# Patient Record
Sex: Male | Born: 1985 | Race: Black or African American | Hispanic: No | Marital: Married | Smoking: Current every day smoker
Health system: Southern US, Community
[De-identification: ages and names within clinical notes are randomized; demographics above are authoritative.]

## PROBLEM LIST (undated history)

## (undated) HISTORY — PX: FOOT SURGERY: SHX648

## (undated) HISTORY — PX: HAND SURGERY: SHX662

---

## 2017-06-27 ENCOUNTER — Emergency Department (HOSPITAL_COMMUNITY): Payer: Medicaid Other

## 2017-06-27 ENCOUNTER — Emergency Department (HOSPITAL_COMMUNITY)
Admission: EM | Admit: 2017-06-27 | Discharge: 2017-06-27 | Disposition: A | Discharge status: Home / Self Care | Payer: Medicaid Other | Attending: Emergency Medicine | Admitting: Emergency Medicine

## 2017-06-27 ENCOUNTER — Encounter (HOSPITAL_COMMUNITY): Payer: Self-pay | Admitting: *Deleted

## 2017-06-27 DIAGNOSIS — X58XXXA Exposure to other specified factors, initial encounter: Secondary | ICD-10-CM | POA: Insufficient documentation

## 2017-06-27 DIAGNOSIS — Y999 Unspecified external cause status: Secondary | ICD-10-CM | POA: Diagnosis not present

## 2017-06-27 DIAGNOSIS — Y939 Activity, unspecified: Secondary | ICD-10-CM | POA: Diagnosis not present

## 2017-06-27 DIAGNOSIS — M79672 Pain in left foot: Secondary | ICD-10-CM

## 2017-06-27 DIAGNOSIS — Y929 Unspecified place or not applicable: Secondary | ICD-10-CM | POA: Insufficient documentation

## 2017-06-27 DIAGNOSIS — F17200 Nicotine dependence, unspecified, uncomplicated: Secondary | ICD-10-CM | POA: Diagnosis not present

## 2017-06-27 DIAGNOSIS — S93402A Sprain of unspecified ligament of left ankle, initial encounter: Secondary | ICD-10-CM | POA: Insufficient documentation

## 2017-06-27 DIAGNOSIS — S99912A Unspecified injury of left ankle, initial encounter: Secondary | ICD-10-CM | POA: Diagnosis present

## 2017-06-27 MED ORDER — IBUPROFEN 400 MG PO TABS: 400.0000 mg | ORAL_TABLET | Freq: Four times a day (QID) | ORAL | 0 refills | Status: DC | PRN

## 2017-06-27 MED ORDER — ACETAMINOPHEN 325 MG PO TABS
650.0000 mg | ORAL_TABLET | Freq: Once | ORAL | Status: AC
  Administered 2017-06-27: 650 mg via ORAL
  Filled 2017-06-27: qty 2

## 2017-06-27 MED ORDER — IBUPROFEN 800 MG PO TABS
800.0000 mg | ORAL_TABLET | Freq: Once | ORAL | Status: AC
  Administered 2017-06-27: 800 mg via ORAL
  Filled 2017-06-27: qty 1

## 2017-06-27 NOTE — ED Triage Notes (Signed)
Lesion on left side of foot

## 2017-06-27 NOTE — Discharge Instructions (Signed)
The x-ray of your foot is negative for fracture or dislocation. Your examination suggest a ankle sprain/strain. There is also an area of your foot behind your little toe that has a callus formation. It is important that you see Dr. Milinda Pointer, or member of his team for podiatry assistance with this issue. Please use the Ace wrap and postoperative shoe until you're seen by Dr. Milinda Pointer. Warm Epsom salt soaks for 10-15 minutes daily may also help with some of the soreness and discomfort. Please use 400 mg of ibuprofen and 500 mg of Tylenol every 6 hours for your discomfort.

## 2017-06-27 NOTE — ED Provider Notes (Signed)
[facility] DEPT Provider Note   CSN: [id number] Arrival date & time: 06/27/17  1118     History   Chief Complaint Chief Complaint  Patient presents with  . Foot Pain    HPI Jeffrey Gardner is a 31 y.o. male.  Patient is a 31 year old male who presents to the emergency department with a complaint of left foot and ankle pain. Patient states that his foot is been bothering him for nearly a month. His been particularly bad in the last 2-3 days. The patient states that sometime ago he twisted his ankle, but beyond that he has been noticing a growth on the side of his foot that is getting progressively worse and causing problems with his work and even with his activities of daily living. He does not recall an injury to his foot other than mentioned he twisted it some time ago. No recent operations or procedures involving the left foot or ankle. The patient has tried Tylenol and ibuprofen but states these have not really been helping very much.        History reviewed. No pertinent past medical history.  There are no active problems to display for this patient.   Past Surgical History:  Procedure Laterality Date  . HAND SURGERY         Home Medications    Prior to Admission medications   Medication Sig Start Date End Date Taking? Authorizing Provider  ibuprofen (ADVIL,MOTRIN) 400 MG tablet Take 1 tablet (400 mg total) by mouth every 6 (six) hours as needed. 06/27/17   Lily Kocher, PA-C    Family History No family history on file.  Social History Social History  Substance Use Topics  . Smoking status: Current Every Day Smoker  . Smokeless tobacco: Never Used  . Alcohol use Yes     Allergies   Patient has no known allergies.   Review of Systems Review of Systems  Constitutional: Negative for activity change.       All ROS Neg except as noted in HPI  HENT: Negative for nosebleeds.   Eyes: Negative for photophobia and discharge.  Respiratory: Negative for  cough, shortness of breath and wheezing.   Cardiovascular: Negative for chest pain and palpitations.  Gastrointestinal: Negative for abdominal pain and blood in stool.  Genitourinary: Negative for dysuria, frequency and hematuria.  Musculoskeletal: Positive for arthralgias. Negative for back pain and neck pain.       Foot and ankle pain.  Skin: Negative.   Neurological: Negative for dizziness, seizures and speech difficulty.  Psychiatric/Behavioral: Negative for confusion and hallucinations.     Physical Exam Updated Vital Signs BP 110/78   Pulse 90   Temp 98.4 F (36.9 C) (Oral)   Resp 20   Ht 5\' 8"  (1.727 m)   Wt 59.4 kg (131 lb)   SpO2 96%   BMI 19.92 kg/m   Physical Exam  Musculoskeletal:       Feet:     ED Treatments / Results  Labs (all labs ordered are listed, but only abnormal results are displayed) Labs Reviewed - No data to display  EKG  EKG Interpretation None       Radiology Dg Foot Complete Left  Result Date: 06/27/2017 CLINICAL DATA:  Soft tissue swelling. Soft tissue prominence laterally with pain EXAM: LEFT FOOT - COMPLETE 3+ VIEW COMPARISON:  None. FINDINGS: Frontal, oblique, and lateral views obtained. There is localized soft tissue prominence lateral to the proximal fifth metatarsal. This area of soft tissue prominence  measures approximately 1.5 x 0.6 cm. There is no calcification or radiopaque foreign body in this area. No fracture or dislocation. No appreciable joint space narrowing or erosion. No bony destruction. There is a benign exostosis arising from the dorsal distal talus. IMPRESSION: Soft tissue prominence lateral to the proximal fifth metatarsal without associated calcification or radiopaque foreign body. A soft tissue mass in this area is questioned. No fracture or dislocation. No erosive change or bony destruction. No appreciable joint space narrowing. Small benign exostosis along the dorsal distal talus. From an imaging standpoint, MR  would be the imaging study of choice to further evaluate soft tissue mass in the foot region. Electronically Signed   By: Lowella Grip III M.D.   On: 06/27/2017 12:17    Procedures Procedures (including critical care time)  Medications Ordered in ED Medications  ibuprofen (ADVIL,MOTRIN) tablet 800 mg (not administered)  acetaminophen (TYLENOL) tablet 650 mg (not administered)     Initial Impression / Assessment and Plan / ED Course  I have reviewed the triage vital signs and the nursing notes.  Pertinent labs & imaging results that were available during my care of the patient were reviewed by me and considered in my medical decision making (see chart for details).      Final Clinical Impressions(s) / ED Diagnoses Vital signs reviewed. X-ray of the leftfoot and ankle are negative for fracture or dislocation. There is noted soft tissue swelling of the lateral portion of the foot.  Examination suggest ankle sprain, and callus formation of the lateral portion of the fifth metatarsal. Ace wrap applied. Patient fitted with a postoperative shoe Patient to use ibuprofen and Tylenol together for discomfort. He will also use warm soaks to the foot. Patient referred to podiatry.   Final diagnoses:  Sprain of left ankle, unspecified ligament, initial encounter  Foot pain, left    New Prescriptions New Prescriptions   IBUPROFEN (ADVIL,MOTRIN) 400 MG TABLET    Take 1 tablet (400 mg total) by mouth every 6 (six) hours as needed.     Lily Kocher, PA-C 06/28/17 1457    Milton Ferguson, MD 06/29/17 [id number]

## 2017-06-27 NOTE — ED Triage Notes (Signed)
L foot pain and swelling  No PCP

## 2017-07-15 ENCOUNTER — Encounter: Payer: Self-pay | Admitting: Podiatry

## 2017-07-15 ENCOUNTER — Ambulatory Visit (INDEPENDENT_AMBULATORY_CARE_PROVIDER_SITE_OTHER): Payer: Medicaid Other | Admitting: Podiatry

## 2017-07-15 DIAGNOSIS — M7752 Other enthesopathy of left foot: Secondary | ICD-10-CM | POA: Diagnosis not present

## 2017-07-15 MED ORDER — DICLOFENAC SODIUM 75 MG PO TBEC: 75.0000 mg | DELAYED_RELEASE_TABLET | Freq: Two times a day (BID) | ORAL | 0 refills | Status: DC

## 2017-07-15 NOTE — Progress Notes (Signed)
   Subjective:    Patient ID: Jeffrey Gardner, male    DOB: [birth year], 31 y.o.   MRN: [id number]  HPI    Review of Systems  Musculoskeletal: Positive for gait problem.  Skin: Positive for color change.  All other systems reviewed and are negative.      Objective:   Physical Exam        Assessment & Plan:

## 2017-07-18 MED ORDER — BETAMETHASONE SOD PHOS & ACET 6 (3-3) MG/ML IJ SUSP: 3.0000 mg | Freq: Once | INTRAMUSCULAR | Status: DC

## 2017-07-18 NOTE — Progress Notes (Signed)
   HPI: Patient is a 31 year old male presenting today as a new patient with a complaint of sharp pain to the lateral side of the left foot that began about 6 months ago. He reports associated swelling of the area. Walking and wearing shoes increase the pain. He has applied Tiger balm and soaked the foot in Epsom salt as treatment. He was told he had a tailor's bunion in the past. Pt was seen in the ED on 06/27/17 and had X-Rays done. He is here for further evaluation and treatment.   No past medical history on file.   Physical Exam: General: The patient is alert and oriented x3 in no acute distress.  Dermatology: Skin is warm, dry and supple bilateral lower extremities. Negative for open lesions or macerations.  Vascular: Palpable pedal pulses bilaterally. No edema or erythema noted. Capillary refill within normal limits.  Neurological: Epicritic and protective threshold grossly intact bilaterally.   Musculoskeletal Exam: Pain on palpation to 5th metatarsal base of left foot. Range of motion within normal limits to all pedal and ankle joints bilateral. Muscle strength 5/5 in all groups bilateral.    Assessment: - Prominent 5th metatarsal tubercle left - Adventitious bursitis left foot   Plan of Care:  - Patient evaluated. ED X-Rays reviewed.  - Injection of 0.5 mLs Celestone Soluspan injected into the base of the 5th metatarsal. Into the adventitious bursa - Post op shoe dispensed.  - Prescription for Diclofenac given to patient. - Return to clinic in 4 weeks; if not better, pt will likely need surgery.    Edrick Kins, DPM [facility]  Dr. Edrick Kins, DPM    [address], [place] [address]                Office [phone]  Fax [phone]

## 2017-07-24 ENCOUNTER — Telehealth: Payer: Self-pay | Admitting: *Deleted

## 2017-07-24 MED ORDER — MELOXICAM 15 MG PO TABS: 15.0000 mg | ORAL_TABLET | Freq: Every day | ORAL | 0 refills | Status: DC

## 2017-07-24 NOTE — Telephone Encounter (Signed)
Received request for alternate medication, pt's insurance does not cover. Dr. Amalia Hailey ordered Meloxicam 15mg  one tablet daily #30.

## 2017-08-15 ENCOUNTER — Ambulatory Visit: Payer: Medicaid Other | Admitting: Podiatry

## 2017-08-18 ENCOUNTER — Encounter (HOSPITAL_COMMUNITY): Payer: Self-pay

## 2017-08-18 ENCOUNTER — Emergency Department (HOSPITAL_COMMUNITY)
Admission: EM | Admit: 2017-08-18 | Discharge: 2017-08-18 | Disposition: A | Discharge status: Home / Self Care | Payer: Medicaid Other | Attending: Emergency Medicine | Admitting: Emergency Medicine

## 2017-08-18 DIAGNOSIS — Z79899 Other long term (current) drug therapy: Secondary | ICD-10-CM | POA: Insufficient documentation

## 2017-08-18 DIAGNOSIS — L720 Epidermal cyst: Secondary | ICD-10-CM | POA: Insufficient documentation

## 2017-08-18 DIAGNOSIS — R220 Localized swelling, mass and lump, head: Secondary | ICD-10-CM | POA: Diagnosis present

## 2017-08-18 DIAGNOSIS — F17200 Nicotine dependence, unspecified, uncomplicated: Secondary | ICD-10-CM | POA: Insufficient documentation

## 2017-08-18 MED ORDER — POVIDONE-IODINE 10 % EX SOLN
CUTANEOUS | Status: DC | PRN
  Administered 2017-08-18: 1 via TOPICAL
  Filled 2017-08-18: qty 15

## 2017-08-18 MED ORDER — LIDOCAINE-EPINEPHRINE (PF) 2 %-1:200000 IJ SOLN
10.0000 mL | Freq: Once | INTRAMUSCULAR | Status: AC
  Administered 2017-08-18: 10 mL via INTRADERMAL
  Filled 2017-08-18: qty 20

## 2017-08-18 MED ORDER — DOXYCYCLINE HYCLATE 100 MG PO TABS
100.0000 mg | ORAL_TABLET | Freq: Once | ORAL | Status: AC
Start: 2017-08-18 — End: 2017-08-18
  Administered 2017-08-18: 100 mg via ORAL
  Filled 2017-08-18: qty 1

## 2017-08-18 MED ORDER — DOXYCYCLINE HYCLATE 100 MG PO CAPS: 100.0000 mg | ORAL_CAPSULE | Freq: Two times a day (BID) | ORAL | 0 refills | Status: DC

## 2017-08-18 NOTE — ED Provider Notes (Signed)
[facility] EMERGENCY DEPARTMENT Provider Note   CSN: [id number] Arrival date & time: 08/18/17  1121     History   Chief Complaint Chief Complaint  Patient presents with  . Facial Swelling    HPI Jeffrey Gardner is a 31 y.o. male.  HPI   Jeffrey Gardner is a 31 y.o. male who presents to the Emergency Department complaining of pain and swelling to the left face.  Has hx of recurrent abscess.  Pain feels similar to previous.  Symptoms begin several hours prior to arrival.  He denies dental pain, difficulty swallowing, fever and neck pain.  Nothing makes the symptoms better or worse.   History reviewed. No pertinent past medical history.  There are no active problems to display for this patient.   Past Surgical History:  Procedure Laterality Date  . HAND SURGERY         Home Medications    Prior to Admission medications   Medication Sig Start Date End Date Taking? Authorizing Provider  meloxicam (MOBIC) 15 MG tablet Take 1 tablet (15 mg total) by mouth daily. 07/24/17  Yes Edrick Kins, DPM  ibuprofen (ADVIL,MOTRIN) 400 MG tablet Take 1 tablet (400 mg total) by mouth every 6 (six) hours as needed. Patient not taking: Reported on 08/18/2017 06/27/17   Lily Kocher, PA-C    Family History No family history on file.  Social History Social History  Substance Use Topics  . Smoking status: Current Every Day Smoker  . Smokeless tobacco: Never Used  . Alcohol use Yes     Allergies   Patient has no known allergies.   Review of Systems Review of Systems  Constitutional: Negative for chills and fever.  Gastrointestinal: Negative for nausea and vomiting.  Musculoskeletal: Negative for arthralgias and joint swelling.  Skin: Positive for color change.       Abscess   Hematological: Negative for adenopathy.  All other systems reviewed and are negative.    Physical Exam Updated Vital Signs BP 128/82 (BP Location: Right Arm)   Pulse 73   Temp 98.5 F (36.9 C)  (Oral)   Resp (!) 1   Ht 5\' 8"  (1.727 m)   Wt 59.9 kg (132 lb)   SpO2 100%   BMI 20.07 kg/m   Physical Exam  Constitutional: He is oriented to person, place, and time. He appears well-developed and well-nourished. No distress.  HENT:  Head: Normocephalic and atraumatic.    Two flesh colored areas of induration and fluctuance to left face.  Pt has chronic appearing acne scars and comedones to the face.   Neck: Normal range of motion.  Cardiovascular: Normal rate, regular rhythm and normal heart sounds.   No murmur heard. Pulmonary/Chest: Effort normal and breath sounds normal. No respiratory distress.  Lymphadenopathy:    He has no cervical adenopathy.  Neurological: He is alert and oriented to person, place, and time. He exhibits normal muscle tone. Coordination normal.  Skin: Skin is warm and dry.  Nursing note and vitals reviewed.    ED Treatments / Results  Labs (all labs ordered are listed, but only abnormal results are displayed) Labs Reviewed - No data to display  EKG  EKG Interpretation None       Radiology No results found.  Procedures Procedures (including critical care time)  INCISION AND DRAINAGE #1  Performed by: Hale Bogus. Consent: Verbal consent obtained. Risks and benefits: risks, benefits and alternatives were discussed Type: abscess  Body area: left face  Anesthesia: local  infiltration  Incision was made with a # 11 scalpel.  Local anesthetic: lidocaine 2 % w/ epinephrine  Anesthetic total: 1 ml  Complexity: complex Blunt dissection to break up loculations  Drainage: purulent  Drainage amount: moderate  Packing material: none Patient tolerance: Patient tolerated the procedure well with no immediate complications.  INCISION AND DRAINAGE #2  Performed by: Hale Bogus. Consent: Verbal consent obtained. Risks and benefits: risks, benefits and alternatives were discussed Type: abscess  Body area: left  face  Anesthesia: local infiltration  Incision was made with a # 11 scalpel.  Local anesthetic: lidocaine 2 % w/ epinephrine  Anesthetic total: 1 ml  Complexity: complex Blunt dissection to break up loculations  Drainage: purulent  Drainage amount: moderate  Packing material: none Patient tolerance: Patient tolerated the procedure well with no immediate complications.    Medications Ordered in ED Medications  povidone-iodine (BETADINE) 10 % external solution (1 application Topical Given 08/18/17 1324)  doxycycline (VIBRA-TABS) tablet 100 mg (not administered)  lidocaine-EPINEPHrine (XYLOCAINE W/EPI) 2 %-1:200000 (PF) injection 10 mL (10 mLs Intradermal Given 08/18/17 1325)     Initial Impression / Assessment and Plan / ED Course  I have reviewed the triage vital signs and the nursing notes.  Pertinent labs & imaging results that were available during my care of the patient were reviewed by me and considered in my medical decision making (see chart for details).     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  Patient agrees to warm compresses prescription for doxycycline. Appears stable for discharge.  Final Clinical Impressions(s) / ED Diagnoses   Final diagnoses:  Epidermal cyst of face    New Prescriptions New Prescriptions   No medications on file     Kem Parkinson, Hershal Coria 08/19/17 1746    Mesner, Corene Cornea, MD 08/21/17 1227

## 2017-08-18 NOTE — Discharge Instructions (Signed)
Continue warm wet compresses on and off to your face until the area heals. Take the antibiotic as directed until its finished. Return here for any worsening symptoms.

## 2017-08-18 NOTE — ED Triage Notes (Signed)
Reports of swelling/possible abscess to left side of face since this morning. Patient reports frequent abscess to face.

## 2017-08-22 ENCOUNTER — Ambulatory Visit (INDEPENDENT_AMBULATORY_CARE_PROVIDER_SITE_OTHER): Payer: Medicaid Other | Admitting: Podiatry

## 2017-08-22 ENCOUNTER — Encounter: Payer: Self-pay | Admitting: Podiatry

## 2017-08-22 DIAGNOSIS — M7752 Other enthesopathy of left foot: Secondary | ICD-10-CM | POA: Diagnosis not present

## 2017-08-22 NOTE — Patient Instructions (Signed)
Pre-Operative Instructions  Congratulations, you have decided to take an important step towards improving your quality of life.  You can be assured that the doctors and staff at [facility] will be with you every step of the way.  Here are some important things you should know:  1. Plan to be at the surgery center/hospital at least 1 (one) hour prior to your scheduled time, unless otherwise directed by the surgical center/hospital staff.  You must have a responsible adult accompany you, remain during the surgery and drive you home.  Make sure you have directions to the surgical center/hospital to ensure you arrive on time. 2. If you are having surgery at [facility] or [facility], you will need a copy of your medical history and physical form from your family physician within one month prior to the date of surgery. We will give you a form for your primary physician to complete.  3. We make every effort to accommodate the date you request for surgery.  However, there are times where surgery dates or times have to be moved.  We will contact you as soon as possible if a change in schedule is required.   4. No aspirin/ibuprofen for one week before surgery.  If you are on aspirin, any non-steroidal anti-inflammatory medications (Mobic, Aleve, Ibuprofen) should not be taken seven (7) days prior to your surgery.  You make take Tylenol for pain prior to surgery.  5. Medications - If you are taking daily heart and blood pressure medications, seizure, reflux, allergy, asthma, anxiety, pain or diabetes medications, make sure you notify the surgery center/hospital before the day of surgery so they can tell you which medications you should take or avoid the day of surgery. 6. No food or drink after midnight the night before surgery unless directed otherwise by surgical center/hospital staff. 7. No alcoholic beverages 24-hours prior to surgery.  No smoking 24-hours prior or 24-hours after  surgery. 8. Wear loose pants or shorts. They should be loose enough to fit over bandages, boots, and casts. 9. Don't wear slip-on shoes. Sneakers are preferred. 10. Bring your boot with you to the surgery center/hospital.  Also bring crutches or a walker if your physician has prescribed it for you.  If you do not have this equipment, it will be provided for you after surgery. 11. If you have not been contacted by the surgery center/hospital by the day before your surgery, call to confirm the date and time of your surgery. 12. Leave-time from work may vary depending on the type of surgery you have.  Appropriate arrangements should be made prior to surgery with your employer. 13. Prescriptions will be provided immediately following surgery by your doctor.  Fill these as soon as possible after surgery and take the medication as directed. Pain medications will not be refilled on weekends and must be approved by the doctor. 14. Remove nail polish on the operative foot and avoid getting pedicures prior to surgery. 15. Wash the night before surgery.  The night before surgery wash the foot and leg well with water and the antibacterial soap provided. Be sure to pay special attention to beneath the toenails and in between the toes.  Wash for at least three (3) minutes. Rinse thoroughly with water and dry well with a towel.  Perform this wash unless told not to do so by your physician.  Enclosed: 1 Ice pack (please put in freezer the night before surgery)   1 Hibiclens skin cleaner     Pre-op instructions  If you have any questions regarding the instructions, please do not hesitate to call our office.  [place]: [address], [place], [place] [address] -- [phone]  [place]: [address] [place], [place] [address] -- [phone]  [place]: [address]  [place], [place] [address] -- [phone]  [place]: [address], [place], [place] [address] -- [phone]  Website:  [email or web address] 

## 2017-08-25 NOTE — Progress Notes (Signed)
   HPI: Patient is a 31 year old male presenting today for follow-up evaluation of left foot pain. He states the pain is worsening. He rates it a 10/10 at this time. He reports the injection and taking diclofenac provided no significant relief. He is here for further evaluation and treatment.    No past medical history on file.   Physical Exam: General: The patient is alert and oriented x3 in no acute distress.  Dermatology: Skin is warm, dry and supple bilateral lower extremities. Negative for open lesions or macerations.  Vascular: Palpable pedal pulses bilaterally. No edema or erythema noted. Capillary refill within normal limits.  Neurological: Epicritic and protective threshold grossly intact bilaterally.   Musculoskeletal Exam: Pain on palpation to 5th metatarsal base of left foot. Range of motion within normal limits to all pedal and ankle joints bilateral. Muscle strength 5/5 in all groups bilateral.    Assessment: - Prominent 5th metatarsal tubercle left - Adventitious bursitis left foot   Plan of Care:  1. Today the patient was evaluated. 2. Today we discussed the conservative versus surgical management of the presenting pathology. The patient opts for surgical management. All possible complications and details of the procedure were explained. All patient questions were answered. No guarantees were expressed or implied. 3. Authorization for surgery was initiated today. Surgery will consist of excision of soft tissue bursa of the left foot and exostectomy fifth metatarsal base left foot. 4. Return to clinic 1 week postop.    Edrick Kins, DPM [facility]  Dr. Edrick Kins, DPM    [address], [place] [address]                Office [phone]  Fax [phone]

## 2017-08-26 ENCOUNTER — Telehealth: Payer: Self-pay | Admitting: Podiatry

## 2017-08-26 ENCOUNTER — Telehealth: Payer: Self-pay | Admitting: *Deleted

## 2017-08-26 NOTE — Telephone Encounter (Signed)
I'm returning your call.  Have you signed consent forms?  "Yes, I signed them when I saw him last week.  I want to get it done as soon as possible because my foot is killing me and I need to get back to work."  He can do it on November 8. "Yes ma'am, that is fine if it's the soonest."  You can register with the surgical center on-line now.  If you don't have access to a computer, they will call you to get needed information.  Someone from the surgical center will call you with the arrival time a day or two prior to the surgery date.  (Please send me the scheduling sheet for this patient.)

## 2017-08-26 NOTE — Telephone Encounter (Signed)
"  I was calling to schedule an appointment with the foot surgeon."

## 2017-09-11 ENCOUNTER — Encounter: Payer: Self-pay | Admitting: Podiatry

## 2017-09-11 DIAGNOSIS — M21542 Acquired clubfoot, left foot: Secondary | ICD-10-CM | POA: Diagnosis not present

## 2017-09-11 DIAGNOSIS — D492 Neoplasm of unspecified behavior of bone, soft tissue, and skin: Secondary | ICD-10-CM | POA: Diagnosis not present

## 2017-09-12 ENCOUNTER — Telehealth: Payer: Self-pay | Admitting: Podiatry

## 2017-09-12 NOTE — Telephone Encounter (Signed)
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 Pt just dropped off a prescription for percocet that was written but a date was not on the prescription. I believe he had surgery today but I cannot fill it unless I verify that it was written today. Trying to figure out what I should do here, I know its after hours but they are waiting on it cause he got out of surgery. If you would give me a call back at [phone]. Thanks. Bye.

## 2017-09-12 NOTE — Telephone Encounter (Signed)
Pt's girlfriend states pt has surgery yesterday, and the pharmacy will not fill because it is not dated. I spoke with [facility] and informed, that pt had surgery yesterday and the script was to be dated for 09/11/2017.

## 2017-09-19 ENCOUNTER — Ambulatory Visit (INDEPENDENT_AMBULATORY_CARE_PROVIDER_SITE_OTHER): Payer: Medicaid Other

## 2017-09-19 ENCOUNTER — Encounter: Payer: Self-pay | Admitting: Podiatry

## 2017-09-19 ENCOUNTER — Ambulatory Visit (INDEPENDENT_AMBULATORY_CARE_PROVIDER_SITE_OTHER): Payer: Medicaid Other | Admitting: Podiatry

## 2017-09-19 ENCOUNTER — Other Ambulatory Visit: Payer: Self-pay

## 2017-09-19 ENCOUNTER — Encounter: Payer: Medicaid Other | Admitting: Podiatry

## 2017-09-19 DIAGNOSIS — Z9889 Other specified postprocedural states: Secondary | ICD-10-CM

## 2017-09-19 DIAGNOSIS — M21542 Acquired clubfoot, left foot: Secondary | ICD-10-CM

## 2017-09-19 MED ORDER — OXYCODONE-ACETAMINOPHEN 5-325 MG PO TABS: ORAL_TABLET | ORAL | 0 refills | Status: DC

## 2017-09-22 NOTE — Progress Notes (Signed)
   Subjective:  Patient presents today status post left 5th metatarsal exostectomy. DOS: 09/11/17. He states he is doing well but is requesting a refill on his pain medication. He also requests an order for crutches. He is here for further evaluation and treatment.    No past medical history on file.    Objective/Physical Exam Skin incisions appear to be well coapted with sutures and staples intact. No sign of infectious process noted. No dehiscence. No active bleeding noted. Moderate edema noted to the surgical extremity.  Radiographic Exam:  Osteotomies sites appear to be stable with routine healing.  Assessment: 1. s/p left 5th metatarsal exostectomy. DOS: 09/11/17   Plan of Care:  1. Patient was evaluated. X-rays reviewed 2. Dressing changed. 3. Continue weightbearing in CAM boot. 4. Refill prescription for Percocet 5/325 mg given to patient. 5. Return to clinic in 1 week.   Edrick Kins, DPM [facility]  Dr. Edrick Kins, [address]                                        [place], [place] [address]                Office [phone]  Fax [phone]

## 2017-09-30 ENCOUNTER — Encounter: Payer: Medicaid Other | Admitting: Podiatry

## 2017-09-30 ENCOUNTER — Encounter: Payer: Self-pay | Admitting: Podiatry

## 2017-09-30 ENCOUNTER — Ambulatory Visit (INDEPENDENT_AMBULATORY_CARE_PROVIDER_SITE_OTHER): Payer: Medicaid Other | Admitting: Podiatry

## 2017-09-30 VITALS — BP 110/88 | HR 98 | Temp 97.9°F

## 2017-09-30 DIAGNOSIS — Z9889 Other specified postprocedural states: Secondary | ICD-10-CM

## 2017-09-30 MED ORDER — OXYCODONE-ACETAMINOPHEN 5-325 MG PO TABS: ORAL_TABLET | ORAL | 0 refills | Status: DC

## 2017-10-02 NOTE — Progress Notes (Signed)
   Subjective:  Patient presents today status post left 5th metatarsal exostectomy. DOS: 09/11/17. He reports sharp pain to the dorsum of the foot. He states he is ready for his staples to be removed. There are no modifying factors noted. Patient is here for further evaluation and treatment.   No past medical history on file.    Objective/Physical Exam Skin incisions appear to be well coapted with sutures and staples intact. No sign of infectious process noted. No dehiscence. No active bleeding noted. Moderate edema noted to the surgical extremity.  Assessment: 1. s/p left 5th metatarsal exostectomy. DOS: 09/11/17   Plan of Care:  1. Patient was evaluated.  2. Staples removed. 3. Refill prescription for Percocet given to patient. 4. Continue weightbearing in CAM boot.  5. Return to clinic in 4 weeks.   Edrick Kins, DPM [facility]  Dr. Edrick Kins, [address]                                        [place], [place] [address]                Office [phone]  Fax [phone]

## 2017-10-24 ENCOUNTER — Ambulatory Visit (INDEPENDENT_AMBULATORY_CARE_PROVIDER_SITE_OTHER): Payer: Medicaid Other | Admitting: Podiatry

## 2017-10-24 ENCOUNTER — Ambulatory Visit (INDEPENDENT_AMBULATORY_CARE_PROVIDER_SITE_OTHER): Payer: Medicaid Other

## 2017-10-24 ENCOUNTER — Encounter: Payer: Self-pay | Admitting: Podiatry

## 2017-10-24 DIAGNOSIS — Z9889 Other specified postprocedural states: Secondary | ICD-10-CM

## 2017-10-24 DIAGNOSIS — M21542 Acquired clubfoot, left foot: Secondary | ICD-10-CM

## 2017-10-26 ENCOUNTER — Emergency Department (HOSPITAL_COMMUNITY)
Admission: EM | Admit: 2017-10-26 | Discharge: 2017-10-26 | Disposition: A | Discharge status: Home / Self Care | Payer: Medicaid Other | Attending: Emergency Medicine | Admitting: Emergency Medicine

## 2017-10-26 ENCOUNTER — Encounter (HOSPITAL_COMMUNITY): Payer: Self-pay | Admitting: Emergency Medicine

## 2017-10-26 ENCOUNTER — Other Ambulatory Visit: Payer: Self-pay

## 2017-10-26 DIAGNOSIS — X58XXXA Exposure to other specified factors, initial encounter: Secondary | ICD-10-CM | POA: Insufficient documentation

## 2017-10-26 DIAGNOSIS — H5713 Ocular pain, bilateral: Secondary | ICD-10-CM | POA: Diagnosis present

## 2017-10-26 DIAGNOSIS — Z79899 Other long term (current) drug therapy: Secondary | ICD-10-CM | POA: Insufficient documentation

## 2017-10-26 DIAGNOSIS — S0502XA Injury of conjunctiva and corneal abrasion without foreign body, left eye, initial encounter: Secondary | ICD-10-CM | POA: Diagnosis not present

## 2017-10-26 DIAGNOSIS — Y939 Activity, unspecified: Secondary | ICD-10-CM | POA: Insufficient documentation

## 2017-10-26 DIAGNOSIS — F17210 Nicotine dependence, cigarettes, uncomplicated: Secondary | ICD-10-CM | POA: Diagnosis not present

## 2017-10-26 DIAGNOSIS — Y929 Unspecified place or not applicable: Secondary | ICD-10-CM | POA: Diagnosis not present

## 2017-10-26 DIAGNOSIS — Y999 Unspecified external cause status: Secondary | ICD-10-CM | POA: Insufficient documentation

## 2017-10-26 MED ORDER — ERYTHROMYCIN 5 MG/GM OP OINT: TOPICAL_OINTMENT | OPHTHALMIC | 0 refills | Status: DC

## 2017-10-26 MED ORDER — TETRACAINE HCL 0.5 % OP SOLN
1.0000 [drp] | Freq: Once | OPHTHALMIC | Status: AC
  Administered 2017-10-26: 1 [drp] via OPHTHALMIC

## 2017-10-26 MED ORDER — TETRACAINE HCL 0.5 % OP SOLN
OPHTHALMIC | Status: AC
  Administered 2017-10-26: 1 [drp] via OPHTHALMIC
  Filled 2017-10-26: qty 4

## 2017-10-26 MED ORDER — CEPHALEXIN 500 MG PO CAPS: 500.0000 mg | ORAL_CAPSULE | Freq: Four times a day (QID) | ORAL | 0 refills | Status: DC

## 2017-10-26 MED ORDER — FLUORESCEIN SODIUM 1 MG OP STRP
ORAL_STRIP | OPHTHALMIC | Status: AC
  Administered 2017-10-26: 1 via OPHTHALMIC
  Filled 2017-10-26: qty 1

## 2017-10-26 MED ORDER — FLUORESCEIN SODIUM 1 MG OP STRP
1.0000 | ORAL_STRIP | Freq: Once | OPHTHALMIC | Status: AC
  Administered 2017-10-26: 1 via OPHTHALMIC

## 2017-10-26 NOTE — ED Triage Notes (Signed)
PT c/o left eye redness, drainage and pain x1 day.

## 2017-10-26 NOTE — ED Provider Notes (Signed)
[facility] EMERGENCY DEPARTMENT Provider Note   CSN: [id number] Arrival date & time: 10/26/17  1140     History   Chief Complaint Chief Complaint  Patient presents with  . Conjunctivitis    HPI Jeffrey Gardner is a 31 y.o. male who presents with a 1 day history of left eye irritation.  Patient thinks he may have gotten something in his eye, like a hair, however he is not able to get anything out of it.  He has a foreign body sensation and irritation.  He has had some mild, intermittent blurriness, however otherwise vision is unaffected.  He wears glasses, but not contacts.  He has had some redness to his left eyelid, but no significant pain.  He feels the irritation and foreign body sensation more when he moves his eye, but denies pain with extraocular movements.  HPI  History reviewed. No pertinent past medical history.  There are no active problems to display for this patient.   Past Surgical History:  Procedure Laterality Date  . FOOT SURGERY    . HAND SURGERY         Home Medications    Prior to Admission medications   Medication Sig Start Date End Date Taking? Authorizing Provider  cephALEXin (KEFLEX) 500 MG capsule Take 1 capsule (500 mg total) by mouth 4 (four) times daily. 10/26/17   Frederica Kuster, PA-C  doxycycline (VIBRAMYCIN) 100 MG capsule Take 1 capsule (100 mg total) by mouth 2 (two) times daily. 08/18/17   Triplett, Tammy, PA-C  erythromycin ophthalmic ointment Place a 1/2 inch ribbon of ointment into the lower eyelid 4 times daily for 5 days. 10/26/17   Cameryn Chrisley, Bea Graff, PA-C  ibuprofen (ADVIL,MOTRIN) 400 MG tablet Take 1 tablet (400 mg total) by mouth every 6 (six) hours as needed. Patient not taking: Reported on 08/18/2017 06/27/17   Lily Kocher, PA-C  meloxicam (MOBIC) 15 MG tablet Take 1 tablet (15 mg total) by mouth daily. 07/24/17   Edrick Kins, DPM  oxyCODONE-acetaminophen (PERCOCET/ROXICET) 5-325 MG tablet take 1 tablet by mouth every 4 hours  if needed for pain 09/30/17   Edrick Kins, DPM    Family History History reviewed. No pertinent family history.  Social History Social History   Tobacco Use  . Smoking status: Current Every Day Smoker    Packs/day: 0.50    Types: Cigarettes  . Smokeless tobacco: Never Used  Substance Use Topics  . Alcohol use: Yes    Comment: occ  . Drug use: No     Allergies   Patient has no known allergies.   Review of Systems Review of Systems  Constitutional: Negative for fever.  Eyes: Positive for pain (irritation, FB sensation), discharge (some intermittent clear), redness and visual disturbance (mild blurred vision). Negative for photophobia.     Physical Exam Updated Vital Signs BP 118/88 (BP Location: Right Arm)   Pulse 97   Temp 98.7 F (37.1 C) (Oral)   Resp 18   Ht 5\' 9"  (1.753 m)   Wt 62.1 kg (137 lb)   SpO2 100%   BMI 20.23 kg/m   Physical Exam  Constitutional: He appears well-developed and well-nourished. No distress.  HENT:  Head: Normocephalic and atraumatic.  Mouth/Throat: Oropharynx is clear and moist. No oropharyngeal exudate.  Eyes: EOM are normal. Pupils are equal, round, and reactive to light. Lids are everted and swept, no foreign bodies found. Right eye exhibits no discharge. Left eye exhibits no discharge. No foreign body  present in the left eye. Left conjunctiva is injected. No scleral icterus.  Slit lamp exam:      The left eye shows corneal abrasion and fluorescein uptake. The left eye shows no corneal flare, no corneal ulcer and no foreign body.    L lid erythematous and mildly edematous No pain with EOMs   Visual Acuity  Right Eye Distance: 20/100 Left Eye Distance: 20/100 Bilateral Distance: 20/70  (pt wears glassess but does not have them on or with him)  Neck: Normal range of motion. Neck supple. No thyromegaly present.  Cardiovascular: Normal rate, regular rhythm, normal heart sounds and intact distal pulses. Exam reveals no gallop  and no friction rub.  No murmur heard. Pulmonary/Chest: Effort normal and breath sounds normal. No stridor. No respiratory distress. He has no wheezes. He has no rales.  Musculoskeletal: He exhibits no edema.  Lymphadenopathy:    He has no cervical adenopathy.  Neurological: He is alert. Coordination normal.  Skin: Skin is warm and dry. No rash noted. He is not diaphoretic. No pallor.  Psychiatric: He has a normal mood and affect.  Nursing note and vitals reviewed.    ED Treatments / Results  Labs (all labs ordered are listed, but only abnormal results are displayed) Labs Reviewed - No data to display  EKG  EKG Interpretation None       Radiology No results found.  Procedures Procedures (including critical care time)  Medications Ordered in ED Medications  fluorescein ophthalmic strip 1 strip (1 strip Right Eye Given 10/26/17 1342)  tetracaine (PONTOCAINE) 0.5 % ophthalmic solution 1 drop (1 drop Left Eye Given 10/26/17 1342)     Initial Impression / Assessment and Plan / ED Course  I have reviewed the triage vital signs and the nursing notes.  Pertinent labs & imaging results that were available during my care of the patient were reviewed by me and considered in my medical decision making (see chart for details).     Pt with corneal abrasion on exam. No evidence of FB.  Exam not concerning for orbital cellulitis, hyphema. No concern for uveitis.  Visual acuity poor, however patient does not have his glasses with him today.  He denies any significant blurred vision outside of baseline.  Patient will be discharged home with erythromycin ointment and Keflex, considering erythema and edema to left lid.  Does not extend around the eye.  No pain with EOMs.   Patient understands to follow up with ophthalmology; return precautions discussed.  Patient understands and agrees with plan.  Patient vitals stable throughout ED course and discharged in satisfactory condition.      Final Clinical Impressions(s) / ED Diagnoses   Final diagnoses:  Abrasion of left cornea, initial encounter    ED Discharge Orders        Ordered    erythromycin ophthalmic ointment  Status:  Discontinued     10/26/17 1339    cephALEXin (KEFLEX) 500 MG capsule  4 times daily     10/26/17 1341    erythromycin ophthalmic ointment     10/26/17 [address] PA-C 10/26/17 Biron, [place], DO 10/28/17 [phone]

## 2017-10-26 NOTE — Discharge Instructions (Signed)
Apply erythromycin ointment 4 times daily in your lower eyelid.  Do this for 5 days.  Also take Keflex 4 times daily for concern for infection starting at your eyelid.  Please be evaluated by ophthalmologist in 2-3 days for recheck.  Please return to the emergency department if you develop any new or worsening symptoms.

## 2017-10-26 NOTE — ED Notes (Signed)
Pt reports he felt particle to left eye yesterday before going to work.  Once getting to work, he felt the particle again when looking up.  Left eye is red and lid is swollen.

## 2017-10-30 NOTE — Progress Notes (Signed)
   Subjective:  Patient presents today status post left 5th metatarsal exostectomy. DOS: 09/11/17.  He states his condition has improved.  He reports pain with walking describing it as needles sticking into the foot.  He also reports an associated callus to the plantar aspect of the lateral left foot.  Patient is here for further evaluation and treatment.   History reviewed. No pertinent past medical history.    Objective/Physical Exam Skin incisions appear to be well coapted. No sign of infectious process noted. No dehiscence. No active bleeding noted. Moderate edema noted to the surgical extremity.  Radiographic Exam:  Osteotomies sites appear to be stable with routine healing.  Assessment: 1. s/p left 5th metatarsal exostectomy. DOS: 09/11/17   Plan of Care:  1.  Patient was evaluated.  X-rays reviewed. 2.  Resume wearing good shoes.  Discontinue wearing cam boot. 3.  Slowly increase activity. 4.  Return to clinic as needed.   Edrick Kins, DPM [facility]  Dr. Edrick Kins, [address]                                        [place], [place] [address]                Office [phone]  Fax [phone]

## 2017-12-07 ENCOUNTER — Emergency Department (HOSPITAL_COMMUNITY)
Admission: EM | Admit: 2017-12-07 | Discharge: 2017-12-07 | Discharge status: Against Medical Advice | Payer: Medicaid Other | Attending: Emergency Medicine | Admitting: Emergency Medicine

## 2017-12-07 ENCOUNTER — Ambulatory Visit (HOSPITAL_COMMUNITY): Admission: RE | Admit: 2017-12-07 | Payer: Medicaid Other | Source: Ambulatory Visit

## 2017-12-07 ENCOUNTER — Other Ambulatory Visit: Payer: Self-pay

## 2017-12-07 ENCOUNTER — Encounter (HOSPITAL_COMMUNITY): Payer: Self-pay

## 2017-12-07 DIAGNOSIS — Z79899 Other long term (current) drug therapy: Secondary | ICD-10-CM | POA: Diagnosis not present

## 2017-12-07 DIAGNOSIS — S61431A Puncture wound without foreign body of right hand, initial encounter: Secondary | ICD-10-CM | POA: Diagnosis not present

## 2017-12-07 DIAGNOSIS — W010XXA Fall on same level from slipping, tripping and stumbling without subsequent striking against object, initial encounter: Secondary | ICD-10-CM | POA: Insufficient documentation

## 2017-12-07 DIAGNOSIS — Y92008 Other place in unspecified non-institutional (private) residence as the place of occurrence of the external cause: Secondary | ICD-10-CM | POA: Insufficient documentation

## 2017-12-07 DIAGNOSIS — F17210 Nicotine dependence, cigarettes, uncomplicated: Secondary | ICD-10-CM | POA: Diagnosis not present

## 2017-12-07 DIAGNOSIS — Y999 Unspecified external cause status: Secondary | ICD-10-CM | POA: Diagnosis not present

## 2017-12-07 DIAGNOSIS — S6000XA Contusion of unspecified finger without damage to nail, initial encounter: Secondary | ICD-10-CM

## 2017-12-07 DIAGNOSIS — S6981XA Other specified injuries of right wrist, hand and finger(s), initial encounter: Secondary | ICD-10-CM | POA: Diagnosis present

## 2017-12-07 DIAGNOSIS — Y939 Activity, unspecified: Secondary | ICD-10-CM | POA: Diagnosis not present

## 2017-12-07 DIAGNOSIS — W19XXXA Unspecified fall, initial encounter: Secondary | ICD-10-CM

## 2017-12-07 DIAGNOSIS — F10920 Alcohol use, unspecified with intoxication, uncomplicated: Secondary | ICD-10-CM | POA: Diagnosis not present

## 2017-12-07 DIAGNOSIS — Y92009 Unspecified place in unspecified non-institutional (private) residence as the place of occurrence of the external cause: Secondary | ICD-10-CM

## 2017-12-07 MED ORDER — POVIDONE-IODINE 10 % EX SOLN
CUTANEOUS | Status: AC
  Filled 2017-12-07: qty 30

## 2017-12-07 NOTE — ED Notes (Signed)
Pt verbally abusive and cursing at staff when registering to be seen. Security and [employer] at side for deescalating and safety. Pt states he has been drinking and smells of ETOH. Pt has son with him. He was escorted back to room with this nurse, security, and [employer] for triage.

## 2017-12-07 NOTE — ED Provider Notes (Signed)
[facility] EMERGENCY DEPARTMENT Provider Note   CSN: [id number] Arrival date & time: 12/07/17  0155     History   Chief Complaint Chief Complaint  Patient presents with  . Laceration    HPI Jeffrey Gardner is a 32 y.o. male.  The history is provided by the patient.  Laceration    He presents having injured his right hand in a fall.  He states that he hit it on the edge of some carpeting and does have a puncture wound in the dorsum of his hand.  He is worried that it is broken.  He admits he has been drinking quite a bit tonight.  He denies other injury.  History reviewed. No pertinent past medical history.  There are no active problems to display for this patient.   Past Surgical History:  Procedure Laterality Date  . FOOT SURGERY    . HAND SURGERY         Home Medications    Prior to Admission medications   Medication Sig Start Date End Date Taking? Authorizing Provider  cephALEXin (KEFLEX) 500 MG capsule Take 1 capsule (500 mg total) by mouth 4 (four) times daily. 10/26/17   Frederica Kuster, PA-C  doxycycline (VIBRAMYCIN) 100 MG capsule Take 1 capsule (100 mg total) by mouth 2 (two) times daily. 08/18/17   Triplett, Tammy, PA-C  erythromycin ophthalmic ointment Place a 1/2 inch ribbon of ointment into the lower eyelid 4 times daily for 5 days. 10/26/17   Law, Bea Graff, PA-C  ibuprofen (ADVIL,MOTRIN) 400 MG tablet Take 1 tablet (400 mg total) by mouth every 6 (six) hours as needed. Patient not taking: Reported on 08/18/2017 06/27/17   Lily Kocher, PA-C  meloxicam (MOBIC) 15 MG tablet Take 1 tablet (15 mg total) by mouth daily. 07/24/17   Edrick Kins, DPM  oxyCODONE-acetaminophen (PERCOCET/ROXICET) 5-325 MG tablet take 1 tablet by mouth every 4 hours if needed for pain 09/30/17   Edrick Kins, DPM    Family History No family history on file.  Social History Social History   Tobacco Use  . Smoking status: Current Every Day Smoker    Packs/day: 0.50   Types: Cigarettes  . Smokeless tobacco: Never Used  Substance Use Topics  . Alcohol use: Yes    Comment: occ  . Drug use: No     Allergies   Patient has no known allergies.   Review of Systems Review of Systems  All other systems reviewed and are negative.    Physical Exam Updated Vital Signs BP (!) 137/102   Pulse (!) 105   Temp (!) 97.5 F (36.4 C) (Oral)   Resp 17   Ht 5\' 9"  (1.753 m)   Wt 59 kg (130 lb)   SpO2 99%   BMI 19.20 kg/m   Physical Exam  Nursing note and vitals reviewed.  32 year old male, resting comfortably and in no acute distress. Vital signs are significant for elevated blood pressure and elevated heart rate. Oxygen saturation is 99%, which is normal. Head is normocephalic and atraumatic. PERRLA, EOMI. Oropharynx is clear. Neck is nontender and supple without adenopathy or JVD. Back is nontender and there is no CVA tenderness. Lungs are clear without rales, wheezes, or rhonchi. Chest is nontender. Heart has regular rate and rhythm without murmur. Abdomen is soft, flat, nontender without masses or hepatosplenomegaly and peristalsis is normoactive. Extremities: Puncture wound noted on the dorsum of the right hand between the first and second metacarpals.  Moderate swelling in the same area.  No other injury seen. Skin is warm and dry without rash. Neurologic: He is clinically intoxicated but remainder of mental status is normal, cranial nerves are intact, there are no motor or sensory deficits.  ED Treatments / Results   Procedures Procedures (including critical care time)  Medications Ordered in ED Medications  povidone-iodine (BETADINE) 10 % external solution (not administered)     Initial Impression / Assessment and Plan / ED Course  I have reviewed the triage vital signs and the nursing notes.  Fall with injury to right hand.  X-ray was ordered, but patient stated that he had been waiting long enough and insisted that he would not stay  for x-ray and left [place] before obtaining x-ray.  Final Clinical Impressions(s) / ED Diagnoses   Final diagnoses:  Fall at home, initial encounter  Alcohol intoxication, uncomplicated ([place])  Contusion of finger of right hand, initial encounter  Puncture wound of right hand, initial encounter    ED Discharge Orders    None       Delora Fuel, MD 34/74/25 [phone]

## 2017-12-07 NOTE — ED Notes (Signed)
Right hand soaked in sterile water and betadine solution.

## 2017-12-07 NOTE — ED Triage Notes (Signed)
Pt reports falling on floor at home at junction between carpet and kitchen floor, secured with possible staples possibly where floor changes.

## 2017-12-07 NOTE — ED Notes (Signed)
Pt walked up to nurses station, cursing and saying he wanted to sign out, and states he "aint' waiting forever". Dr Roxanne Mins aware and attempted to talk pt into having xray and completing treatment. Pt refused and making rude statements towards staff. Pt signed out AMA and was escorted out lobby door by [employer] officer and security. Pt son was with him.

## 2017-12-08 NOTE — Progress Notes (Signed)
DOS11/08/18 Excision of soft tissue mass/bursa Lt Exostectomy 5th metatarsal tubercle Lt

## 2017-12-09 ENCOUNTER — Emergency Department
Admission: EM | Admit: 2017-12-09 | Discharge: 2017-12-09 | Disposition: A | Discharge status: Home / Self Care | Payer: Medicaid Other | Attending: Emergency Medicine | Admitting: Emergency Medicine

## 2017-12-09 ENCOUNTER — Emergency Department: Payer: Medicaid Other

## 2017-12-09 ENCOUNTER — Ambulatory Visit: Payer: Medicaid Other | Admitting: Podiatry

## 2017-12-09 ENCOUNTER — Encounter: Payer: Self-pay | Admitting: Emergency Medicine

## 2017-12-09 ENCOUNTER — Ambulatory Visit (INDEPENDENT_AMBULATORY_CARE_PROVIDER_SITE_OTHER): Payer: Medicaid Other

## 2017-12-09 DIAGNOSIS — M7752 Other enthesopathy of left foot: Secondary | ICD-10-CM

## 2017-12-09 DIAGNOSIS — F17210 Nicotine dependence, cigarettes, uncomplicated: Secondary | ICD-10-CM | POA: Insufficient documentation

## 2017-12-09 DIAGNOSIS — Z79899 Other long term (current) drug therapy: Secondary | ICD-10-CM | POA: Insufficient documentation

## 2017-12-09 DIAGNOSIS — X58XXXA Exposure to other specified factors, initial encounter: Secondary | ICD-10-CM | POA: Diagnosis not present

## 2017-12-09 DIAGNOSIS — Y999 Unspecified external cause status: Secondary | ICD-10-CM | POA: Diagnosis not present

## 2017-12-09 DIAGNOSIS — B070 Plantar wart: Secondary | ICD-10-CM | POA: Diagnosis not present

## 2017-12-09 DIAGNOSIS — S62201A Unspecified fracture of first metacarpal bone, right hand, initial encounter for closed fracture: Secondary | ICD-10-CM | POA: Diagnosis not present

## 2017-12-09 DIAGNOSIS — Y939 Activity, unspecified: Secondary | ICD-10-CM | POA: Diagnosis not present

## 2017-12-09 DIAGNOSIS — S6991XA Unspecified injury of right wrist, hand and finger(s), initial encounter: Secondary | ICD-10-CM | POA: Diagnosis present

## 2017-12-09 DIAGNOSIS — Y929 Unspecified place or not applicable: Secondary | ICD-10-CM | POA: Diagnosis not present

## 2017-12-09 MED ORDER — NAPROXEN 500 MG PO TABS
500.0000 mg | ORAL_TABLET | Freq: Once | ORAL | Status: AC
  Administered 2017-12-09: 500 mg via ORAL
  Filled 2017-12-09: qty 1

## 2017-12-09 MED ORDER — IBUPROFEN 600 MG PO TABS: 600.0000 mg | ORAL_TABLET | Freq: Three times a day (TID) | ORAL | 0 refills | Status: DC | PRN

## 2017-12-09 MED ORDER — OXYCODONE-ACETAMINOPHEN 5-325 MG PO TABS: ORAL_TABLET | ORAL | 0 refills | Status: DC

## 2017-12-09 MED ORDER — OXYCODONE-ACETAMINOPHEN 5-325 MG PO TABS: 1.0000 | ORAL_TABLET | Freq: Four times a day (QID) | ORAL | 0 refills | Status: DC | PRN

## 2017-12-09 MED ORDER — OXYCODONE-ACETAMINOPHEN 5-325 MG PO TABS
1.0000 | ORAL_TABLET | Freq: Once | ORAL | Status: AC
Start: 2017-12-09 — End: 2017-12-09
  Administered 2017-12-09: 1 via ORAL
  Filled 2017-12-09: qty 1

## 2017-12-09 NOTE — ED Provider Notes (Signed)
[facility] Emergency Department Provider Note    ____________________________________________   First MD Initiated Contact with Patient 12/09/17 1132     (approximate)  I have reviewed the triage vital signs and the nursing notes.   HISTORY  Chief Complaint Hand Pain    HPI Jeffrey Gardner is a 32 y.o. male patient complained of right hand pain secondary to a fall last night.  Patient awakened this morning with increased swelling and pain.  Patient denies loss of sensation or loss of function of the right nondominant hand.  Patient rates pain as a 10/10.  Patient described the pain is "aching".  No pulses measured prior to arrival.  History reviewed. No pertinent past medical history.  There are no active problems to display for this patient.   Past Surgical History:  Procedure Laterality Date  . FOOT SURGERY    . HAND SURGERY      Prior to Admission medications   Medication Sig Start Date End Date Taking? Authorizing Provider  cephALEXin (KEFLEX) 500 MG capsule Take 1 capsule (500 mg total) by mouth 4 (four) times daily. 10/26/17   Frederica Kuster, PA-C  doxycycline (VIBRAMYCIN) 100 MG capsule Take 1 capsule (100 mg total) by mouth 2 (two) times daily. 08/18/17   Triplett, Tammy, PA-C  erythromycin ophthalmic ointment Place a 1/2 inch ribbon of ointment into the lower eyelid 4 times daily for 5 days. 10/26/17   Law, Bea Graff, PA-C  ibuprofen (ADVIL,MOTRIN) 400 MG tablet Take 1 tablet (400 mg total) by mouth every 6 (six) hours as needed. Patient not taking: Reported on 08/18/2017 06/27/17   Lily Kocher, PA-C  ibuprofen (ADVIL,MOTRIN) 600 MG tablet Take 1 tablet (600 mg total) by mouth every 8 (eight) hours as needed. 12/09/17   Sable Feil, PA-C  meloxicam (MOBIC) 15 MG tablet Take 1 tablet (15 mg total) by mouth daily. 07/24/17   Edrick Kins, DPM  oxyCODONE-acetaminophen (PERCOCET/ROXICET) 5-325 MG tablet take 1 tablet by mouth every 4 hours  if needed for pain 12/09/17   Edrick Kins, DPM  oxyCODONE-acetaminophen (PERCOCET/ROXICET) 5-325 MG tablet Take 1 tablet by mouth every 6 (six) hours as needed for severe pain. 12/09/17   Sable Feil, PA-C    Allergies Patient has no known allergies.  No family history on file.  Social History Social History   Tobacco Use  . Smoking status: Current Every Day Smoker    Packs/day: 0.50    Types: Cigarettes  . Smokeless tobacco: Never Used  Substance Use Topics  . Alcohol use: Yes    Comment: occ  . Drug use: No    Review of Systems Constitutional: No fever/chills Eyes: No visual changes. ENT: No sore throat. Cardiovascular: Denies chest pain. Respiratory: Denies shortness of breath. Gastrointestinal: No abdominal pain.  No nausea, no vomiting.  No diarrhea.  No constipation. Genitourinary: Negative for dysuria. Musculoskeletal: Right hand pain and edema Skin: Negative for rash. Neurological: Negative for headaches, focal weakness or numbness.   ____________________________________________   PHYSICAL EXAM:  VITAL SIGNS: ED Triage Vitals [12/09/17 1118]  Enc Vitals Group     BP (!) 142/81     Pulse Rate 78     Resp 20     Temp 98.6 F (37 C)     Temp Source Oral     SpO2 99 %     Weight 132 lb (59.9 kg)     Height 5\' 8"  (1.727 m)  Head Circumference      Peak Flow      Pain Score 10     Pain Loc      Pain Edu?      Excl. in [place]?    Constitutional: Alert and oriented. Well appearing and in no acute distress. Cardiovascular: Normal rate, regular rhythm. Grossly normal heart sounds.  Good peripheral circulation. Respiratory: Normal respiratory effort.  No retractions. Lungs CTAB. Musculoskeletal: No obvious deformity to the right hand.  Moderate edema dorsal aspect of the right hand.. Neurologic:  Normal speech and language. No gross focal neurologic deficits are appreciated. No gait instability. Skin:  Skin is warm, dry and intact. No rash  noted. Psychiatric: Mood and affect are normal. Speech and behavior are normal.  ____________________________________________   LABS (all labs ordered are listed, but only abnormal results are displayed)  Labs Reviewed - No data to display ____________________________________________  EKG   ____________________________________________  RADIOLOGY  ED MD interpretation: X-ray revealed metacarpal head fracture to the second digit right hand.  Official radiology report(s): Dg Hand Complete Right  Result Date: 12/09/2017 CLINICAL DATA:  [place] hurting right hand.  Pain and swelling. EXAM: RIGHT HAND - COMPLETE 3+ VIEW COMPARISON:  None. FINDINGS: There is a acute and comminuted fracture deformity involving the distal shaft of the second metacarpal bone. Mild volar angulation of the distal fracture fragments. No dislocations. Dorsal soft tissue swelling noted. IMPRESSION: 1. Acute and comminuted fracture deformity involves the distal shaft of the second metacarpal bone. Electronically Signed   By: Kerby Moors M.D.   On: 12/09/2017 11:41   Dg Foot Complete Left  Result Date: 12/09/2017 Please see detailed radiograph report in office note.   ____________________________________________   PROCEDURES  Procedure(s) performed: None  Procedures  Critical Care performed: No  ____________________________________________   INITIAL IMPRESSION / ASSESSMENT AND PLAN / ED COURSE  As part of my medical decision making, I reviewed the following data within the electronic MEDICAL RECORD NUMBER   Right hand pain edema secondary to metacarpal fractures to the second digit right hand.  Discussed x-ray findings with patient.  Patient placed in a volar splint.  Patient advised to follow-up with orthopedic by calling for an appointment when he departure from the emergency room.  Patient advised take medication as directed.     ____________________________________________   FINAL CLINICAL  IMPRESSION(S) / ED DIAGNOSES  Final diagnoses:  Closed nondisplaced fracture of first metacarpal bone of right hand, unspecified portion of metacarpal, initial encounter     ED Discharge Orders        Ordered    oxyCODONE-acetaminophen (PERCOCET/ROXICET) 5-325 MG tablet  Every 6 hours PRN     12/09/17 1155    ibuprofen (ADVIL,MOTRIN) 600 MG tablet  Every 8 hours PRN     12/09/17 1155       Note:  This document was prepared using Dragon voice recognition software and may include unintentional dictation errors.    Sable Feil, PA-C 12/09/17 1202    Earleen Newport, MD 12/09/17 [phone]

## 2017-12-09 NOTE — Discharge Instructions (Signed)
Wear splint until evaluation by orthopedics. °

## 2017-12-09 NOTE — ED Triage Notes (Signed)
Pt reports was playing last night and fell hurting his right hand. Pt with swelling noted to right hand. Pt able to move fingers but complains of pain.

## 2017-12-11 NOTE — Progress Notes (Signed)
   Subjective:  Patient presents today status post left 5th metatarsal exostectomy. DOS: 09/11/17. He reports significant pain to the lateral side of the left foot. He describes it as a constant ache. He also reports a painful lesion to the lateral side of the left foot. He has not done anything to treat the area. Standing for long periods of time increases the pain. Patient is here for further evaluation and treatment.   No past medical history on file.    Objective/Physical Exam Skin incisions appear to be well coapted. No sign of infectious process noted. No dehiscence. No active bleeding noted. Moderate edema noted to the surgical extremity. Hyperkeratotic skin lesion noted to the plantar aspect of the left foot approximately 1 cm in diameter. Pinpoint bleeding noted upon debridement. Pain on palpation to the noted skin lesion.  Radiographic Exam:  Osteotomies sites appear to be stable with routine healing.  Assessment: 1. s/p left 5th metatarsal exostectomy. DOS: 09/11/17 2. Plantar wart left foot   Plan of Care:  1. Patient was evaluated.  X-rays reviewed. 2. Excisional debridement of the plantar wart lesion was performed using a chisel blade. Cantharone was applied and the lesion was dressed with a dry sterile dressing. 3. Return to clinic in 2 weeks.  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    Edrick Kins, DPM [facility]  Dr. Edrick Kins, [place]                                        [place], [place] [address]                Office [phone]  Fax [phone]

## 2017-12-23 ENCOUNTER — Ambulatory Visit: Payer: Medicaid Other | Admitting: Podiatry

## 2017-12-30 ENCOUNTER — Encounter: Payer: Self-pay | Admitting: Podiatry

## 2017-12-30 ENCOUNTER — Ambulatory Visit: Payer: Medicaid Other | Admitting: Podiatry

## 2017-12-30 DIAGNOSIS — D492 Neoplasm of unspecified behavior of bone, soft tissue, and skin: Secondary | ICD-10-CM

## 2017-12-30 DIAGNOSIS — B070 Plantar wart: Secondary | ICD-10-CM

## 2018-01-01 NOTE — Progress Notes (Signed)
   Subjective: 32 year old male presenting today status post left 5th metatarsal exostectomy. DOS: 09/11/17. He is here for follow up evaluation of a plantar wart to the left foot. He states the wart has improved but reports some continued intermittent sharp pain from the surgical site. Patient is here for further evaluation and treatment.   No past medical history on file.  Objective: Physical Exam General: The patient is alert and oriented x3 in no acute distress.  Dermatology: Hyperkeratotic skin lesion noted to the plantar aspect of the left foot approximately 1 cm in diameter. Pinpoint bleeding noted upon debridement. Skin is warm, dry and supple bilateral lower extremities. Negative for open lesions or macerations.  Vascular: Palpable pedal pulses bilaterally. No edema or erythema noted. Capillary refill within normal limits.  Neurological: Epicritic and protective threshold grossly intact bilaterally.   Musculoskeletal Exam: Pain on palpation to the note skin lesion.  Range of motion within normal limits to all pedal and ankle joints bilateral. Muscle strength 5/5 in all groups bilateral.   Assessment: #1 s/p left 5th metatarsal exostectomy DOS 09/11/17 #2 plantar wart left foot  Plan of Care:  #1 Patient was evaluated. #2 Excisional debridement of the plantar wart lesion was performed using a chisel blade. Salinocaine was applied and the lesion was dressed with a dry sterile dressing. #3 Recommended good shoe gear. #4 patient is to return to clinic as needed.   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   Edrick Kins, DPM [facility]  Dr. Edrick Kins, [place]                                        [place], [place] [address]                Office [phone]  Fax [phone]

## 2018-02-13 IMAGING — DX DG HAND COMPLETE 3+V*R*
3 series · 3 of 3 positions shown · non-contrast
Comparison: None.

CLINICAL DATA: Fell hurting right hand.  Pain and swelling.

EXAM:
RIGHT HAND - COMPLETE 3+ VIEW

[hand ap]
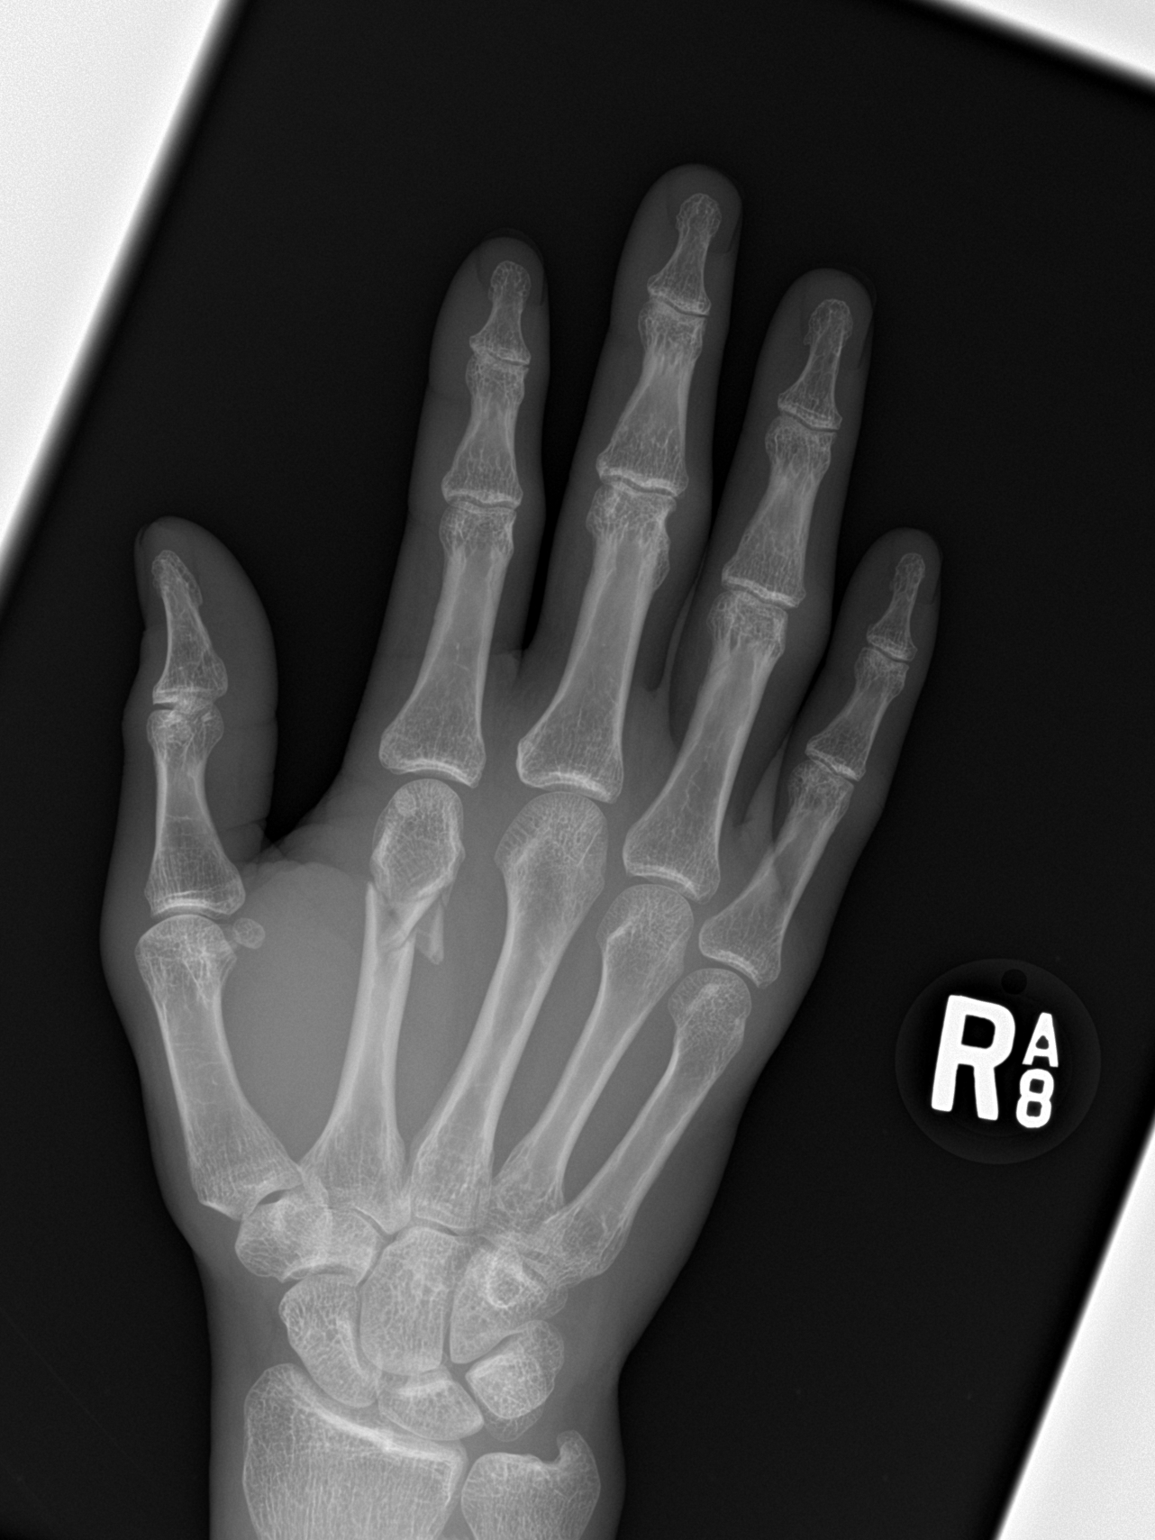

[hand obl]
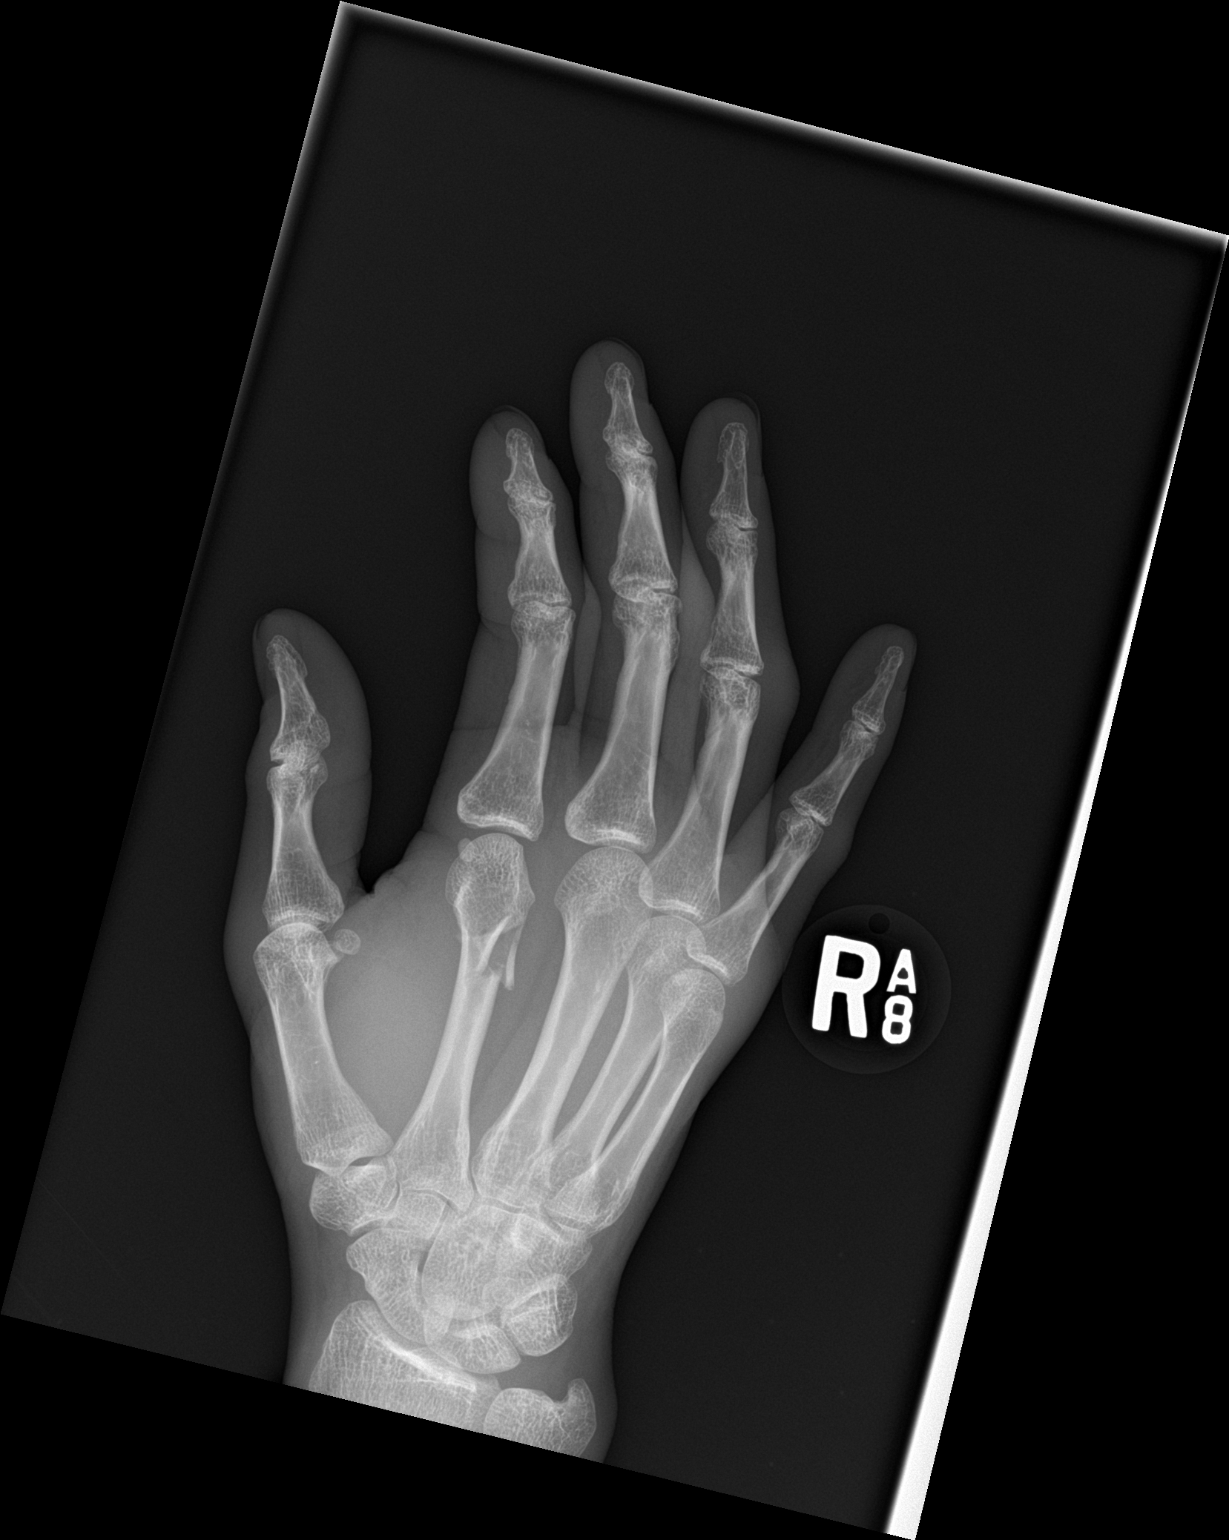

[hand lat]
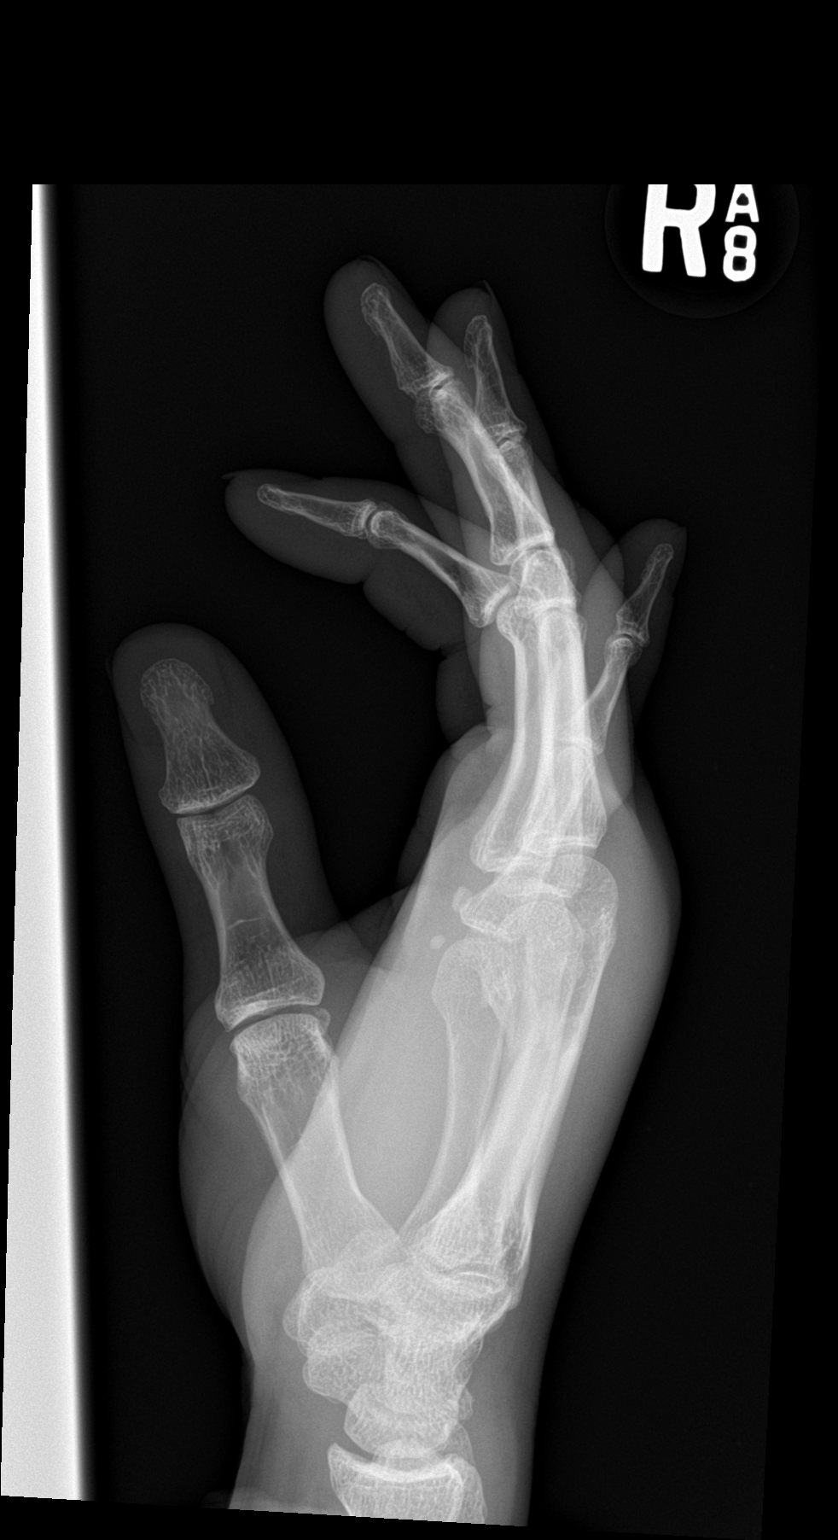

[3 of 3 positions shown; findings below may reference images not displayed]

FINDINGS: There is a acute and comminuted fracture deformity involving the
distal shaft of the second metacarpal bone. Mild volar angulation of
the distal fracture fragments. No dislocations. Dorsal soft tissue
swelling noted.
IMPRESSION: 1. Acute and comminuted fracture deformity involves the distal shaft
of the second metacarpal bone.

## 2018-03-02 ENCOUNTER — Encounter (HOSPITAL_COMMUNITY): Payer: Self-pay

## 2018-03-02 ENCOUNTER — Emergency Department (HOSPITAL_COMMUNITY)
Admission: EM | Admit: 2018-03-02 | Discharge: 2018-03-02 | Disposition: A | Discharge status: Home / Self Care | Payer: Medicaid Other | Attending: Emergency Medicine | Admitting: Emergency Medicine

## 2018-03-02 DIAGNOSIS — L0201 Cutaneous abscess of face: Secondary | ICD-10-CM | POA: Insufficient documentation

## 2018-03-02 DIAGNOSIS — R220 Localized swelling, mass and lump, head: Secondary | ICD-10-CM | POA: Diagnosis present

## 2018-03-02 DIAGNOSIS — F17210 Nicotine dependence, cigarettes, uncomplicated: Secondary | ICD-10-CM | POA: Insufficient documentation

## 2018-03-02 DIAGNOSIS — Z79899 Other long term (current) drug therapy: Secondary | ICD-10-CM | POA: Diagnosis not present

## 2018-03-02 MED ORDER — POVIDONE-IODINE 10 % EX SOLN
CUTANEOUS | Status: AC
  Filled 2018-03-02: qty 15

## 2018-03-02 MED ORDER — LIDOCAINE-EPINEPHRINE-TETRACAINE (LET) SOLUTION
3.0000 mL | Freq: Once | NASAL | Status: AC
  Administered 2018-03-02: 3 mL via TOPICAL
  Filled 2018-03-02: qty 3

## 2018-03-02 MED ORDER — LIDOCAINE-EPINEPHRINE (PF) 1 %-1:200000 IJ SOLN
10.0000 mL | Freq: Once | INTRAMUSCULAR | Status: AC
  Administered 2018-03-02: 10 mL via INTRADERMAL
  Filled 2018-03-02: qty 30

## 2018-03-02 MED ORDER — CLINDAMYCIN HCL 150 MG PO CAPS
300.0000 mg | ORAL_CAPSULE | Freq: Once | ORAL | Status: AC
  Administered 2018-03-02: 300 mg via ORAL
  Filled 2018-03-02: qty 2

## 2018-03-02 MED ORDER — HYDROCODONE-ACETAMINOPHEN 5-325 MG PO TABS: 1.0000 | ORAL_TABLET | Freq: Four times a day (QID) | ORAL | 0 refills | Status: DC | PRN

## 2018-03-02 MED ORDER — HYDROCODONE-ACETAMINOPHEN 5-325 MG PO TABS
1.0000 | ORAL_TABLET | Freq: Once | ORAL | Status: AC
  Administered 2018-03-02: 1 via ORAL
  Filled 2018-03-02: qty 1

## 2018-03-02 MED ORDER — CLINDAMYCIN HCL 150 MG PO CAPS: 300.0000 mg | ORAL_CAPSULE | Freq: Three times a day (TID) | ORAL | 0 refills | Status: AC

## 2018-03-02 NOTE — ED Notes (Signed)
Supplies for drainage set up in room

## 2018-03-02 NOTE — Discharge Instructions (Addendum)
As discussed, there are many methods to reduce the likelihood of a recurrence of your abscess. In the short-term, please keep your wound clean, dry, and monitor your condition carefully. Return here for concerning changes in your condition.

## 2018-03-02 NOTE — ED Provider Notes (Signed)
[facility] EMERGENCY DEPARTMENT Provider Note   CSN: [id number] Arrival date & time: 03/02/18  1154     History   Chief Complaint Chief Complaint  Patient presents with  . Abscess    HPI Jeffrey Gardner is a 32 y.o. male.  HPI Presents with concern of facial abscess. He notes that this episode began about 2 days ago, and a similar to several prior episodes including one a few years ago requiring hospitalization. Initially the patient had swelling, and discomfort about a small area just lateral to his nasal bridge on the right. Subsequently has developed pain throughout the right upper face, with increasing erythema. No fever, no nausea, no vomiting. Minimal relief with OTC medication. Patient has seen a dermatologist for his recurrent lesions, but notes that in spite of using cream for 1 year he continues to have issues. He states that he is otherwise generally well. History reviewed. No pertinent past medical history.  There are no active problems to display for this patient.   Past Surgical History:  Procedure Laterality Date  . FOOT SURGERY    . HAND SURGERY          Home Medications    Prior to Admission medications   Medication Sig Start Date End Date Taking? Authorizing Provider  clindamycin (CLEOCIN) 150 MG capsule Take 2 capsules (300 mg total) by mouth 3 (three) times daily for 7 days. 03/02/18 03/09/18  Carmin Muskrat, MD  erythromycin ophthalmic ointment Place a 1/2 inch ribbon of ointment into the lower eyelid 4 times daily for 5 days. 10/26/17   Frederica Kuster, PA-C  HYDROcodone-acetaminophen (NORCO/VICODIN) 5-325 MG tablet Take 1 tablet by mouth every 6 (six) hours as needed for severe pain. 03/02/18   Carmin Muskrat, MD  ibuprofen (ADVIL,MOTRIN) 600 MG tablet Take 1 tablet (600 mg total) by mouth every 8 (eight) hours as needed. 12/09/17   Sable Feil, PA-C  meloxicam (MOBIC) 15 MG tablet Take 1 tablet (15 mg total) by mouth daily. 07/24/17   Edrick Kins, DPM  oxyCODONE-acetaminophen (PERCOCET/ROXICET) 5-325 MG tablet take 1 tablet by mouth every 4 hours if needed for pain 12/09/17   Edrick Kins, DPM  oxyCODONE-acetaminophen (PERCOCET/ROXICET) 5-325 MG tablet Take 1 tablet by mouth every 6 (six) hours as needed for severe pain. 12/09/17   Sable Feil, PA-C    Family History No family history on file.  Social History Social History   Tobacco Use  . Smoking status: Current Every Day Smoker    Packs/day: 0.50    Types: Cigarettes  . Smokeless tobacco: Never Used  Substance Use Topics  . Alcohol use: Yes    Comment: occ  . Drug use: No     Allergies   Patient has no known allergies.   Review of Systems Review of Systems  Constitutional:       Per HPI, otherwise negative  HENT:       Per HPI, otherwise negative  Respiratory:       Per HPI, otherwise negative  Cardiovascular:       Per HPI, otherwise negative  Gastrointestinal: Negative for vomiting.  Endocrine:       Negative aside from HPI  Genitourinary:       Neg aside from HPI   Musculoskeletal:       Per HPI, otherwise negative  Skin: Positive for color change and wound.  Neurological: Negative for syncope.     Physical Exam Updated Vital Signs BP Marland Kitchen)  127/97 (BP Location: Right Arm)   Pulse (!) 101   Temp 98.4 F (36.9 C) (Oral)   Resp 18   Wt 70.8 kg (156 lb)   SpO2 100%   BMI 23.72 kg/m   Physical Exam  Constitutional: He is oriented to person, place, and time. He appears well-developed. No distress.  HENT:  Head: Normocephalic.    Mouth/Throat: Uvula is midline, oropharynx is clear and moist and mucous membranes are normal.  Eyes: Conjunctivae and EOM are normal.  Cardiovascular: Normal rate and regular rhythm.  Pulmonary/Chest: Effort normal. No stridor. No respiratory distress.  Abdominal: He exhibits no distension.  Musculoskeletal: He exhibits no edema.  Neurological: He is alert and oriented to person, place, and time.    Skin: Skin is warm and dry.  Psychiatric: He has a normal mood and affect.  Nursing note and vitals reviewed.    ED Treatments / Results   Procedures .Marland KitchenIncision and Drainage Date/Time: 03/02/2018 1:45 PM Performed by: Carmin Muskrat, MD Authorized by: Carmin Muskrat, MD   Consent:    Consent obtained:  Verbal   Consent given by:  Patient   Risks discussed:  Bleeding, incomplete drainage and pain   Alternatives discussed:  No treatment, delayed treatment and alternative treatment Location:    Type:  Abscess   Size:  4cm   Location:  Head   Head location:  Face Pre-procedure details:    Skin preparation:  Betadine Anesthesia (see MAR for exact dosages):    Anesthesia method:  Local infiltration and topical application   Topical anesthetic:  LET   Local anesthetic:  Lidocaine 1% WITH epi Procedure type:    Complexity:  Simple Procedure details:    Needle aspiration: yes     Needle size:  18 G   Incision types:  Single straight   Incision depth:  Subcutaneous   Scalpel blade:  11   Wound management:  Probed and deloculated   Drainage:  Purulent   Drainage amount:  Moderate   Wound treatment:  Wound left open   Packing materials:  None Post-procedure details:    Patient tolerance of procedure:  Tolerated well, no immediate complications   (including critical care time)  Medications Ordered in ED Medications  lidocaine-EPINEPHrine (XYLOCAINE-EPINEPHrine) 1 %-1:200000 (PF) injection 10 mL (has no administration in time range)  povidone-iodine (BETADINE) 10 % external solution (has no administration in time range)  lidocaine-EPINEPHrine-tetracaine (LET) solution (3 mLs Topical Given 03/02/18 1244)  HYDROcodone-acetaminophen (NORCO/VICODIN) 5-325 MG per tablet 1 tablet (1 tablet Oral Given 03/02/18 1245)  clindamycin (CLEOCIN) capsule 300 mg (300 mg Oral Given 03/02/18 1245)     Initial Impression / Assessment and Plan / ED Course  I have reviewed the triage vital  signs and the nursing notes.  Pertinent labs & imaging results that were available during my care of the patient were reviewed by me and considered in my medical decision making (see chart for details).     1:44 PM Patient tolerated incision and drainage of his facial abscess well, without complication. We had a lengthy conversation about monitoring, home care, importance of taking antibiotics as directed. No evidence for bacteremia, sepsis, though there is some suspicion for facial cellulitis. With improvement in his abscess here, no red flags, patient was discharged with ongoing clindamycin therapy, return precautions, follow-up instructions.  Final Clinical Impressions(s) / ED Diagnoses   Final diagnoses:  Facial abscess    ED Discharge Orders        Ordered  clindamycin (CLEOCIN) 150 MG capsule  3 times daily     03/02/18 1344    HYDROcodone-acetaminophen (NORCO/VICODIN) 5-325 MG tablet  Every 6 hours PRN     03/02/18 1344       Carmin Muskrat, MD 03/02/18 1346

## 2018-03-02 NOTE — ED Triage Notes (Signed)
Pt reports a bump that came up on right side of nose 2 days ago. Now right eye and side of face swollen and painful

## 2018-03-02 NOTE — ED Notes (Signed)
Hurts to chew on right side. Pt states had a headache on right side.

## 2018-08-18 ENCOUNTER — Other Ambulatory Visit (HOSPITAL_COMMUNITY): Payer: Self-pay | Admitting: Nurse Practitioner

## 2018-08-18 ENCOUNTER — Ambulatory Visit (HOSPITAL_COMMUNITY)
Admission: RE | Admit: 2018-08-18 | Discharge: 2018-08-18 | Disposition: A | Discharge status: Home / Self Care | Payer: Medicaid Other | Source: Ambulatory Visit | Attending: Nurse Practitioner | Admitting: Nurse Practitioner

## 2018-08-18 DIAGNOSIS — M5440 Lumbago with sciatica, unspecified side: Secondary | ICD-10-CM

## 2018-08-18 DIAGNOSIS — G8929 Other chronic pain: Secondary | ICD-10-CM | POA: Insufficient documentation

## 2018-08-18 DIAGNOSIS — M549 Dorsalgia, unspecified: Secondary | ICD-10-CM | POA: Insufficient documentation

## 2018-11-10 ENCOUNTER — Encounter (HOSPITAL_COMMUNITY): Payer: Self-pay | Admitting: Emergency Medicine

## 2018-11-10 ENCOUNTER — Emergency Department (HOSPITAL_COMMUNITY)
Admission: EM | Admit: 2018-11-10 | Discharge: 2018-11-10 | Disposition: A | Discharge status: Home / Self Care | Payer: Medicaid Other | Attending: Emergency Medicine | Admitting: Emergency Medicine

## 2018-11-10 ENCOUNTER — Other Ambulatory Visit: Payer: Self-pay

## 2018-11-10 DIAGNOSIS — F17210 Nicotine dependence, cigarettes, uncomplicated: Secondary | ICD-10-CM | POA: Diagnosis not present

## 2018-11-10 DIAGNOSIS — Z79899 Other long term (current) drug therapy: Secondary | ICD-10-CM | POA: Insufficient documentation

## 2018-11-10 DIAGNOSIS — H9201 Otalgia, right ear: Secondary | ICD-10-CM | POA: Diagnosis present

## 2018-11-10 DIAGNOSIS — H6091 Unspecified otitis externa, right ear: Secondary | ICD-10-CM

## 2018-11-10 MED ORDER — DOXYCYCLINE HYCLATE 100 MG PO CAPS: 100.0000 mg | ORAL_CAPSULE | Freq: Two times a day (BID) | ORAL | 0 refills | Status: DC

## 2018-11-10 MED ORDER — NEOMYCIN-POLYMYXIN-HC 1 % OT SOLN
3.0000 [drp] | Freq: Once | OTIC | Status: AC
Start: 2018-11-10 — End: 2018-11-10
  Administered 2018-11-10: 3 [drp] via OTIC
  Filled 2018-11-10: qty 10

## 2018-11-10 MED ORDER — DOXYCYCLINE HYCLATE 100 MG PO TABS
100.0000 mg | ORAL_TABLET | Freq: Once | ORAL | Status: AC
  Administered 2018-11-10: 100 mg via ORAL
  Filled 2018-11-10: qty 1

## 2018-11-10 MED ORDER — IBUPROFEN 800 MG PO TABS
800.0000 mg | ORAL_TABLET | Freq: Once | ORAL | Status: AC
  Administered 2018-11-10: 800 mg via ORAL
  Filled 2018-11-10: qty 1

## 2018-11-10 NOTE — ED Triage Notes (Signed)
Patient complains of right ear pain. States he can feel a little bump inside his ear. States regular use of quetips in ears.

## 2018-11-10 NOTE — Discharge Instructions (Signed)
Please use 3 drops of the Cortisporin otic.  Please use doxycycline 2 times daily with food.  Use Tylenol extra strength every 4 hours as needed for discomfort.  Please see Dr.Byers for ear nose and throat evaluation since this problem is recurrent.

## 2018-11-10 NOTE — ED Provider Notes (Signed)
[facility] EMERGENCY DEPARTMENT Provider Note   CSN: [id number] Arrival date & time: 11/10/18  1033     History   Chief Complaint Chief Complaint  Patient presents with  . Otalgia    HPI Jeffrey Gardner is a 33 y.o. male.  Patient is a 33 year old male who presents to the emergency department with a complaint of right ear pain.  Patient states that he has a history of cyst and skin infections.  He has a recurrent situation that occurs in his right ear.  He says that he was cleaning his ears when he noticed a tender area, he had his girlfriend a look in his ear and she noticed a swollen area near the opening of the right ear.  The patient states he uses Q-tips, but states he uses Q-tips only on the outside structures of the ear and does not put them inside the ear.  He does not recall any injury to the ear or anything that may have broken the skin.  He does not recall a history of methicillin-resistant staph.  He presents now for assistance with this issue.   Otalgia  Associated symptoms: no abdominal pain, no cough and no neck pain     History reviewed. No pertinent past medical history.  There are no active problems to display for this patient.   Past Surgical History:  Procedure Laterality Date  . FOOT SURGERY    . HAND SURGERY          Home Medications    Prior to Admission medications   Medication Sig Start Date End Date Taking? Authorizing Provider  erythromycin ophthalmic ointment Place a 1/2 inch ribbon of ointment into the lower eyelid 4 times daily for 5 days. 10/26/17   Frederica Kuster, PA-C  HYDROcodone-acetaminophen (NORCO/VICODIN) 5-325 MG tablet Take 1 tablet by mouth every 6 (six) hours as needed for severe pain. 03/02/18   Carmin Muskrat, MD  ibuprofen (ADVIL,MOTRIN) 600 MG tablet Take 1 tablet (600 mg total) by mouth every 8 (eight) hours as needed. 12/09/17   Sable Feil, PA-C  meloxicam (MOBIC) 15 MG tablet Take 1 tablet (15 mg total) by mouth daily.  07/24/17   Edrick Kins, DPM  oxyCODONE-acetaminophen (PERCOCET/ROXICET) 5-325 MG tablet take 1 tablet by mouth every 4 hours if needed for pain 12/09/17   Edrick Kins, DPM  oxyCODONE-acetaminophen (PERCOCET/ROXICET) 5-325 MG tablet Take 1 tablet by mouth every 6 (six) hours as needed for severe pain. 12/09/17   Sable Feil, PA-C    Family History No family history on file.  Social History Social History   Tobacco Use  . Smoking status: Current Every Day Smoker    Packs/day: 0.50    Types: Cigarettes  . Smokeless tobacco: Never Used  Substance Use Topics  . Alcohol use: Yes    Comment: occ  . Drug use: No     Allergies   Patient has no known allergies.   Review of Systems Review of Systems  Constitutional: Negative for activity change.       All ROS Neg except as noted in HPI  HENT: Positive for ear pain. Negative for nosebleeds.   Eyes: Negative for photophobia and discharge.  Respiratory: Negative for cough, shortness of breath and wheezing.   Cardiovascular: Negative for chest pain and palpitations.  Gastrointestinal: Negative for abdominal pain and blood in stool.  Genitourinary: Negative for dysuria, frequency and hematuria.  Musculoskeletal: Negative for arthralgias, back pain and neck pain.  Skin: Negative.   Neurological: Negative for dizziness, seizures and speech difficulty.  Psychiatric/Behavioral: Negative for confusion and hallucinations.     Physical Exam Updated Vital Signs BP 110/90 (BP Location: Right Arm)   Pulse 88   Temp 98.1 F (36.7 C) (Oral)   Resp 16   Ht 5\' 9"  (1.753 m)   Wt 59.4 kg   SpO2 100%   BMI 19.35 kg/m   Physical Exam Vitals signs and nursing note reviewed.  Constitutional:      Appearance: He is well-developed. He is not toxic-appearing.  HENT:     Head: Normocephalic.     Right Ear: Tympanic membrane and external ear normal.     Left Ear: Tympanic membrane and external ear normal.     Ears:     Comments: There  is a small tender red raised area at the 7 o'clock position of the opening of the right external auditory canal.  There is mild redness of the external auditory canal.  The portion of the tympanic membrane seen appears to be within normal limits.  There is no mastoid involvement.  There is no pre-or postauricular nodes appreciated. Eyes:     General: Lids are normal.     Pupils: Pupils are equal, round, and reactive to light.  Neck:     Musculoskeletal: Normal range of motion and neck supple.     Vascular: No carotid bruit.  Cardiovascular:     Rate and Rhythm: Normal rate and regular rhythm.     Pulses: Normal pulses.     Heart sounds: Normal heart sounds.  Pulmonary:     Effort: No respiratory distress.     Breath sounds: Normal breath sounds.  Abdominal:     General: Bowel sounds are normal.     Palpations: Abdomen is soft.     Tenderness: There is no abdominal tenderness. There is no guarding.  Musculoskeletal: Normal range of motion.  Lymphadenopathy:     Head:     Right side of head: No submandibular adenopathy.     Left side of head: No submandibular adenopathy.     Cervical: No cervical adenopathy.  Skin:    General: Skin is warm and dry.  Neurological:     Mental Status: He is alert and oriented to person, place, and time.     Cranial Nerves: No cranial nerve deficit.     Sensory: No sensory deficit.  Psychiatric:        Speech: Speech normal.      ED Treatments / Results  Labs (all labs ordered are listed, but only abnormal results are displayed) Labs Reviewed - No data to display  EKG None  Radiology No results found.  Procedures Procedures (including critical care time)  Medications Ordered in ED Medications  NEOMYCIN-POLYMYXIN-HYDROCORTISONE (CORTISPORIN) OTIC (EAR) solution 3 drop (3 drops Right EAR Given 11/10/18 1212)  doxycycline (VIBRA-TABS) tablet 100 mg (100 mg Oral Given 11/10/18 1212)  ibuprofen (ADVIL,MOTRIN) tablet 800 mg (800 mg Oral Given  11/10/18 1212)     Initial Impression / Assessment and Plan / ED Course  I have reviewed the triage vital signs and the nursing notes.  Pertinent labs & imaging results that were available during my care of the patient were reviewed by me and considered in my medical decision making (see chart for details).       Final Clinical Impressions(s) / ED Diagnoses MDM  Vital signs reviewed.  Pulse oximetry is 100% on room air.  Within normal  limits by my interpretation.  Patient has a radiated raised area just inside the opening of the external auditory canal.  There are no pre-or postauricular nodes appreciated.  The patient will be treated with Cortisporin, as well as doxycycline.  Patient to follow-up with ear nose and throat physician if not improving.  Patient is in agreement with this plan.   Final diagnoses:  Otitis externa of right ear, unspecified chronicity, unspecified type    ED Discharge Orders    None       Lily Kocher, PA-C 11/10/18 2234    Orlie Dakin, MD 11/11/18 [phone]

## 2019-01-23 ENCOUNTER — Encounter (HOSPITAL_COMMUNITY): Payer: Self-pay | Admitting: Emergency Medicine

## 2019-01-23 ENCOUNTER — Emergency Department (HOSPITAL_COMMUNITY)
Admission: EM | Admit: 2019-01-23 | Discharge: 2019-01-23 | Disposition: A | Discharge status: Home / Self Care | Payer: Medicaid Other | Attending: Emergency Medicine | Admitting: Emergency Medicine

## 2019-01-23 ENCOUNTER — Other Ambulatory Visit: Payer: Self-pay

## 2019-01-23 DIAGNOSIS — Z5321 Procedure and treatment not carried out due to patient leaving prior to being seen by health care provider: Secondary | ICD-10-CM | POA: Diagnosis not present

## 2019-01-23 DIAGNOSIS — Z7721 Contact with and (suspected) exposure to potentially hazardous body fluids: Secondary | ICD-10-CM
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
## 2019-01-23 NOTE — ED Notes (Signed)
Pt comes out and wants to leave it is his birthday and his family is waiting, He will take himself and his son for test at their pcp Spoke with Meryl Crutch PA and signed AMA

## 2019-05-11 ENCOUNTER — Other Ambulatory Visit: Payer: Self-pay

## 2019-05-11 ENCOUNTER — Encounter: Payer: Self-pay | Admitting: Podiatry

## 2019-05-11 ENCOUNTER — Ambulatory Visit: Payer: Medicaid Other | Admitting: Podiatry

## 2019-05-11 VITALS — Temp 98.2°F

## 2019-05-11 DIAGNOSIS — L989 Disorder of the skin and subcutaneous tissue, unspecified: Secondary | ICD-10-CM

## 2019-05-11 DIAGNOSIS — D492 Neoplasm of unspecified behavior of bone, soft tissue, and skin: Secondary | ICD-10-CM

## 2019-05-13 NOTE — Progress Notes (Signed)
   Subjective: 33 y.o. male presenting to the office today for follow up evaluation of a painful plantar wart noted to the left foot. He states the wart reappeared 2-3 months ago. He states the lesion is becoming increasingly more painful. Walking intensifies the pain. He has not done anything for treatment. Patient is here for further evaluation and treatment.   History reviewed. No pertinent past medical history.   Objective:  Physical Exam General: Alert and oriented x3 in no acute distress  Dermatology: Hyperkeratotic lesion(s) present on the fifth metatarsal tubercle of the left foot. Pain on palpation with a central nucleated core noted. Skin is warm, dry and supple bilateral lower extremities. Negative for open lesions or macerations.  Vascular: Palpable pedal pulses bilaterally. No edema or erythema noted. Capillary refill within normal limits.  Neurological: Epicritic and protective threshold grossly intact bilaterally.   Musculoskeletal Exam: Pain on palpation at the keratotic lesion(s) noted. Range of motion within normal limits bilateral. Muscle strength 5/5 in all groups bilateral.  Assessment: 1. 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 Patient evaluated 2. Excisional debridement of keratoic lesion using a chisel blade was performed without incident.  3. Salinocaine applied. Light dressing placed. 4. Patient is to return to the clinic PRN.   Edrick Kins, DPM [facility]  Dr. Edrick Kins, [address]                                        [place], [place] [address]                Office [phone]  Fax [phone]

## 2019-07-30 ENCOUNTER — Encounter: Payer: Self-pay | Admitting: Podiatry

## 2019-07-30 ENCOUNTER — Ambulatory Visit: Payer: Medicaid Other | Admitting: Podiatry

## 2019-07-30 ENCOUNTER — Other Ambulatory Visit: Payer: Self-pay

## 2019-07-30 DIAGNOSIS — G5762 Lesion of plantar nerve, left lower limb: Secondary | ICD-10-CM | POA: Diagnosis not present

## 2019-07-30 DIAGNOSIS — G5792 Unspecified mononeuropathy of left lower limb: Secondary | ICD-10-CM

## 2019-07-30 DIAGNOSIS — L989 Disorder of the skin and subcutaneous tissue, unspecified: Secondary | ICD-10-CM

## 2019-07-30 MED ORDER — OXYCODONE-ACETAMINOPHEN 5-325 MG PO TABS: 1.0000 | ORAL_TABLET | Freq: Four times a day (QID) | ORAL | 0 refills | Status: DC | PRN

## 2019-07-30 MED ORDER — GABAPENTIN 100 MG PO CAPS: 100.0000 mg | ORAL_CAPSULE | Freq: Three times a day (TID) | ORAL | 0 refills | Status: DC

## 2019-07-30 NOTE — Progress Notes (Signed)
   Subjective: Established patient presents today for follow-up evaluation regarding a porokeratosis to the fifth metatarsal tubercle of the left foot.  Patient states that he is extremely painful to walk on.  He does have a history of exostectomy to the fifth metatarsal tubercles but shaving the tubercles did not help alleviate pain.  Continues to have stinging sharp pains when walking.  He says that is more painful now than it was before surgery.  No past medical history on file.   Objective:  Physical Exam General: Alert and oriented x3 in no acute distress  Dermatology: Hyperkeratotic lesion(s) present on the fifth metatarsal tubercle of the left foot. Pain on palpation with a central nucleated core noted. Skin is warm, dry and supple bilateral lower extremities. Negative for open lesions or macerations.  Vascular: Palpable pedal pulses bilaterally. No edema or erythema noted. Capillary refill within normal limits.  Neurological: Epicritic and protective threshold grossly intact bilaterally.  Positive Tinel sign with percussion of the fifth metatarsal tubercle consistent with neuritis  Musculoskeletal Exam: Pain on palpation at the keratotic lesion(s) noted. Range of motion within normal limits bilateral. Muscle strength 5/5 in all groups bilateral.  Assessment: 1. 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  Neuritis left lateral foot   Plan of Care:  1. Patient evaluated 2. Excisional debridement of keratoic lesion using a chisel blade was performed without incident.  3. Salinocaine applied. Light dressing placed. 4.  Recommend that the patient apply salicylic acid (Salinocaine provided) daily 5.  Prescription for Percocet 5/305 mg 6.  Prescription for gabapentin 100 mg 3 times daily as needed neuritis 7.  Return to clinic in 4 weeks  Edrick Kins, DPM [facility]  Dr. Edrick Kins, [address] [place]                                         [place], [place] [address]                Office [phone]  Fax [phone]

## 2019-08-27 ENCOUNTER — Ambulatory Visit: Payer: Medicaid Other | Admitting: Podiatry

## 2019-10-12 ENCOUNTER — Encounter: Payer: Self-pay | Admitting: Podiatry

## 2019-10-12 ENCOUNTER — Other Ambulatory Visit: Payer: Self-pay | Admitting: Podiatry

## 2019-10-12 ENCOUNTER — Ambulatory Visit (INDEPENDENT_AMBULATORY_CARE_PROVIDER_SITE_OTHER): Payer: Medicaid Other | Admitting: Podiatry

## 2019-10-12 ENCOUNTER — Other Ambulatory Visit: Payer: Self-pay

## 2019-10-12 DIAGNOSIS — L989 Disorder of the skin and subcutaneous tissue, unspecified: Secondary | ICD-10-CM

## 2019-10-12 MED ORDER — OXYCODONE-ACETAMINOPHEN 5-325 MG PO TABS: 1.0000 | ORAL_TABLET | Freq: Four times a day (QID) | ORAL | 0 refills | Status: DC | PRN

## 2019-10-15 NOTE — Progress Notes (Signed)
   Subjective: 33 year old male presenting today for follow up evaluation of porokeratosis of the left foot. He reports continued pain that has not gotten any better since his last visit. He reports associated pain and hard skin around the area. He has been taking Percocet for treatment. Walking and applying pressure to the area increases the pain. Patient is here for further evaluation and treatment.    History reviewed. No pertinent past medical history.   Objective:  Physical Exam General: Alert and oriented x3 in no acute distress  Dermatology: Hyperkeratotic lesion(s) present on the fifth metatarsal tubercle of the left foot. Pain on palpation with a central nucleated core noted. Skin is warm, dry and supple bilateral lower extremities. Negative for open lesions or macerations.  Vascular: Palpable pedal pulses bilaterally. No edema or erythema noted. Capillary refill within normal limits.  Neurological: Epicritic and protective threshold grossly intact bilaterally.    Musculoskeletal Exam: Pain on palpation at the keratotic lesion(s) noted. Range of motion within normal limits bilateral. Muscle strength 5/5 in all groups bilateral.  Assessment: 1. 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 Patient evaluated 2. Excisional debridement of keratoic lesion using a chisel blade was performed without incident.  3. Cantharone applied. Light dressing placed. 4. Refill prescription for Percocet 5/325 mg #30 provided to patient.  5. Return to clinic in 3 weeks.   Edrick Kins, DPM [facility]  Dr. Edrick Kins, [address]                                        [place], [place] [address]                Office [phone]  Fax [phone]

## 2019-11-02 ENCOUNTER — Ambulatory Visit: Payer: Medicaid Other | Admitting: Podiatry

## 2019-11-09 ENCOUNTER — Encounter: Payer: Self-pay | Admitting: Podiatry

## 2019-11-09 ENCOUNTER — Other Ambulatory Visit: Payer: Self-pay

## 2019-11-09 ENCOUNTER — Ambulatory Visit: Payer: Medicaid Other | Admitting: Podiatry

## 2019-11-09 DIAGNOSIS — L989 Disorder of the skin and subcutaneous tissue, unspecified: Secondary | ICD-10-CM

## 2019-11-09 DIAGNOSIS — G5792 Unspecified mononeuropathy of left lower limb: Secondary | ICD-10-CM

## 2019-11-09 DIAGNOSIS — B070 Plantar wart: Secondary | ICD-10-CM

## 2019-11-09 DIAGNOSIS — D492 Neoplasm of unspecified behavior of bone, soft tissue, and skin: Secondary | ICD-10-CM

## 2019-11-09 MED ORDER — OXYCODONE-ACETAMINOPHEN 5-325 MG PO TABS: 1.0000 | ORAL_TABLET | Freq: Four times a day (QID) | ORAL | 0 refills | Status: DC | PRN

## 2019-11-12 NOTE — Progress Notes (Signed)
   Subjective: 34 year old male presenting today for follow up evaluation of porokeratosis of the left foot. He reports aching, stinging pain that is exacerbated by walking. He reports shooting pain up the left lower extremity secondary to left foot surgery. He has been taking Percocet for pain. Patient is here for further evaluation and treatment.   No past medical history on file.   Objective:  Physical Exam General: Alert and oriented x3 in no acute distress  Dermatology: Hyperkeratotic lesion(s) present on the fifth metatarsal tubercle of the left foot. Pain on palpation with a central nucleated core noted. Skin is warm, dry and supple bilateral lower extremities. Negative for open lesions or macerations.  Vascular: Palpable pedal pulses bilaterally. No edema or erythema noted. Capillary refill within normal limits.  Neurological: Epicritic and protective threshold grossly intact bilaterally.    Musculoskeletal Exam: Pain on palpation at the keratotic lesion(s) noted. Range of motion within normal limits bilateral. Muscle strength 5/5 in all groups bilateral.  Assessment: 1. 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 Patient evaluated 2. Excisional debridement of keratoic lesion using a chisel blade was performed without incident.  3. Cantharone applied. Light dressing placed. 4. Refill prescription for Percocet 5/325 mg #30 provided to patient.  5. Return to clinic in 3 weeks.   Edrick Kins, DPM [facility]  Dr. Edrick Kins, [address]                                        [place], [place] [address]                Office [phone]  Fax [phone]

## 2019-11-30 ENCOUNTER — Ambulatory Visit: Payer: Medicaid Other | Admitting: Podiatry

## 2019-12-07 ENCOUNTER — Other Ambulatory Visit: Payer: Self-pay

## 2019-12-07 ENCOUNTER — Ambulatory Visit: Payer: Medicaid Other | Admitting: Podiatry

## 2019-12-07 DIAGNOSIS — B070 Plantar wart: Secondary | ICD-10-CM | POA: Diagnosis not present

## 2019-12-07 MED ORDER — OXYCODONE-ACETAMINOPHEN 5-325 MG PO TABS: 1.0000 | ORAL_TABLET | Freq: Four times a day (QID) | ORAL | 0 refills | Status: DC | PRN

## 2019-12-10 NOTE — Progress Notes (Signed)
   Subjective: 34 year old male presenting today for follow up evaluation of porokeratosis of the left foot. He states he is doing very well and has improved significantly. He reports pain only when on his feet for long periods of time. He takes Percocet for the treatment of that pain which helps alleviate it. Patient is here for further evaluation and treatment.   No past medical history on file.   Objective:  Physical Exam General: Alert and oriented x3 in no acute distress  Dermatology: Hyperkeratotic lesion(s) present on the fifth metatarsal tubercle of the left foot. Pain on palpation with a central nucleated core noted. Skin is warm, dry and supple bilateral lower extremities. Negative for open lesions or macerations.  Vascular: Palpable pedal pulses bilaterally. No edema or erythema noted. Capillary refill within normal limits.  Neurological: Epicritic and protective threshold grossly intact bilaterally.    Musculoskeletal Exam: Pain on palpation at the keratotic lesion(s) noted. Range of motion within normal limits bilateral. Muscle strength 5/5 in all groups bilateral.  Assessment: 1. 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 Patient evaluated 2. Excisional debridement of keratoic lesion using a chisel blade was performed without incident.  3. Cantharone applied. Light dressing placed. 4. Refill prescription for Percocet 5/325 mg provided to patient.  5. Return to clinic in 3 weeks.   Edrick Kins, DPM [facility]  Dr. Edrick Kins, [place]                                        [place], [place] [address]                Office [phone]  Fax [phone]

## 2019-12-28 ENCOUNTER — Ambulatory Visit: Payer: Medicaid Other | Admitting: Podiatry

## 2020-01-11 ENCOUNTER — Ambulatory Visit: Payer: Medicaid Other | Admitting: Podiatry

## 2020-03-31 ENCOUNTER — Other Ambulatory Visit: Payer: Self-pay

## 2020-03-31 ENCOUNTER — Encounter: Payer: Self-pay | Admitting: Podiatry

## 2020-03-31 ENCOUNTER — Ambulatory Visit (INDEPENDENT_AMBULATORY_CARE_PROVIDER_SITE_OTHER): Payer: Medicaid Other | Admitting: Podiatry

## 2020-03-31 DIAGNOSIS — G5792 Unspecified mononeuropathy of left lower limb: Secondary | ICD-10-CM

## 2020-03-31 DIAGNOSIS — L989 Disorder of the skin and subcutaneous tissue, unspecified: Secondary | ICD-10-CM

## 2020-03-31 DIAGNOSIS — G5762 Lesion of plantar nerve, left lower limb: Secondary | ICD-10-CM

## 2020-04-01 NOTE — Progress Notes (Signed)
   Subjective: 34 y.o. male presenting to the office today for follow up evaluation of porokeratosis to the left lateral foot. He reports worsening aching, stinging pain and swelling. Touching the area and walking increases the symptoms. He states he works on his feet all day and has had to take time off due to the pain. Patient is here for further evaluation and treatment.   No past medical history on file.   Objective:  Physical Exam General: Alert and oriented x3 in no acute distress  Dermatology: Hyperkeratotic lesion(s) present on the left 5th metatarsal tubercle. Pain on palpation with a central nucleated core noted. Skin is warm, dry and supple bilateral lower extremities. Negative for open lesions or macerations.  Vascular: Palpable pedal pulses bilaterally. No edema or erythema noted. Capillary refill within normal limits.  Neurological: Epicritic and protective threshold grossly intact bilaterally.   Musculoskeletal Exam: Pain on palpation at the keratotic lesion(s) noted. Range of motion within normal limits bilateral. Muscle strength 5/5 in all groups bilateral.  Assessment: 1. 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 Patient evaluated 2. Excisional debridement of keratoic lesion using a chisel blade was performed without incident.  3. Cantharone applied. Light dressing placed. 4. Refill prescription for Percocet 5/325 mg provided to patient.  5. Return to clinic in 3 weeks.    Edrick Kins, DPM [facility]  Dr. Edrick Kins, [place]                                        [place], [place] [address]                Office [phone]  Fax [phone]

## 2020-04-04 ENCOUNTER — Telehealth: Payer: Self-pay | Admitting: Podiatry

## 2020-04-04 MED ORDER — OXYCODONE-ACETAMINOPHEN 5-325 MG PO TABS: 1.0000 | ORAL_TABLET | Freq: Four times a day (QID) | ORAL | 0 refills | Status: DC | PRN

## 2020-04-04 NOTE — Telephone Encounter (Signed)
Per Dr. Amalia Hailey, medication has been sent to his pharmacy.  Patient's wife informed of medictions

## 2020-04-04 NOTE — Telephone Encounter (Signed)
Pt called and state that this is his second ca;ll of the day he spoke to someone earlier. Not sure who but pt wants to know when Dr.Evans was gonna call in something for his pain from his visit on 03/28/20 and he has not sent the medication in to pharmacy. Please advise

## 2020-04-04 NOTE — Telephone Encounter (Signed)
Please call pt when the medication is called in please requested from pt

## 2020-04-28 ENCOUNTER — Ambulatory Visit: Payer: Medicaid Other | Admitting: Podiatry

## 2020-04-28 ENCOUNTER — Encounter: Payer: Self-pay | Admitting: Podiatry

## 2020-04-28 ENCOUNTER — Other Ambulatory Visit: Payer: Self-pay

## 2020-04-28 DIAGNOSIS — G5792 Unspecified mononeuropathy of left lower limb: Secondary | ICD-10-CM

## 2020-04-28 DIAGNOSIS — G5762 Lesion of plantar nerve, left lower limb: Secondary | ICD-10-CM | POA: Diagnosis not present

## 2020-04-28 DIAGNOSIS — L989 Disorder of the skin and subcutaneous tissue, unspecified: Secondary | ICD-10-CM

## 2020-04-28 MED ORDER — OXYCODONE-ACETAMINOPHEN 5-325 MG PO TABS: 1.0000 | ORAL_TABLET | Freq: Four times a day (QID) | ORAL | 0 refills | Status: DC | PRN

## 2020-04-28 NOTE — Progress Notes (Signed)
   Subjective: 34 y.o. male presenting to the office today for follow up evaluation of porokeratosis to the left lateral foot.  Patient states that he there is improvement to the callus area.  He does continue to have sharp stabbing shooting sensations occasionally.  Last visit on 03/31/2020 Cantharone was applied.  He has been applying OTC corn and callus remover to the area.  He presents for further treatment evaluation  No past medical history on file.   Objective:  Physical Exam General: Alert and oriented x3 in no acute distress  Dermatology: Hyperkeratotic lesion(s) present on the left 5th metatarsal tubercle. Pain on palpation with a central nucleated core noted. Skin is warm, dry and supple bilateral lower extremities. Negative for open lesions or macerations.  Vascular: Palpable pedal pulses bilaterally. No edema or erythema noted. Capillary refill within normal limits.  Neurological: Epicritic and protective threshold grossly intact bilaterally.   Musculoskeletal Exam: Pain on palpation at the keratotic lesion(s) noted. Range of motion within normal limits bilateral. Muscle strength 5/5 in all groups bilateral.  Assessment: 1. 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 Patient evaluated 2. Excisional debridement of keratoic lesion using a chisel blade was performed without incident.  3.  Salicylic acid applied. Light dressing placed. 4. Refill prescription for Percocet 5/325 mg provided to patient.  5. Return to clinic in 4 weeks.  I explained to the patient that there is beginning to be some improvement to the callus area.  Potentially at next visit we may extend the patient's return to clinic date for 3 months  Edrick Kins, DPM [facility]  Dr. Edrick Kins, [place]                                        [place], [place] [address]                Office [phone]  Fax [phone]

## 2020-06-06 ENCOUNTER — Ambulatory Visit (INDEPENDENT_AMBULATORY_CARE_PROVIDER_SITE_OTHER): Payer: Medicaid Other | Admitting: Podiatry

## 2020-06-06 ENCOUNTER — Encounter: Payer: Self-pay | Admitting: Podiatry

## 2020-06-06 ENCOUNTER — Other Ambulatory Visit: Payer: Self-pay

## 2020-06-06 DIAGNOSIS — M216X2 Other acquired deformities of left foot: Secondary | ICD-10-CM | POA: Diagnosis not present

## 2020-06-06 DIAGNOSIS — L988 Other specified disorders of the skin and subcutaneous tissue: Secondary | ICD-10-CM

## 2020-06-06 DIAGNOSIS — L989 Disorder of the skin and subcutaneous tissue, unspecified: Secondary | ICD-10-CM

## 2020-06-06 NOTE — Progress Notes (Signed)
   Subjective: 34 y.o. male presenting to the office today for follow up evaluation of porokeratosis to the left lateral foot.  Patient states that he there is improvement to the callus area.  He has been applying OTC corn and callus remover daily under occlusion with a Band-Aid.  He has noticed significant improvement.  No new complaints at this time.  No past medical history on file.   Objective:  Physical Exam General: Alert and oriented x3 in no acute distress  Dermatology: Hyperkeratotic lesion(s) present on the left 5th metatarsal tubercle. Pain on palpation with a central nucleated core noted. Skin is warm, dry and supple bilateral lower extremities. Negative for open lesions or macerations.  Vascular: Palpable pedal pulses bilaterally. No edema or erythema noted. Capillary refill within normal limits.  Neurological: Epicritic and protective threshold grossly intact bilaterally.   Musculoskeletal Exam: Pain on palpation at the keratotic lesion(s) noted. Range of motion within normal limits bilateral. Muscle strength 5/5 in all groups bilateral.  Assessment: 1. 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 Patient evaluated 2. Excisional debridement of keratoic lesion using a chisel blade was performed without incident.  3.  Salicylic acid applied. Light dressing placed. 4.  Today recommend OTC Compound W daily under occlusion with a Band-Aid.  5. Return to clinic in 4 weeks.  Potentially we will extend his return appointment if the callus area continues to improve  Edrick Kins, DPM [facility]  Dr. Edrick Kins, [address]                                        [place], [place] [address]                Office [phone]  Fax [phone]

## 2020-07-07 ENCOUNTER — Ambulatory Visit: Payer: Medicaid Other | Admitting: Podiatry

## 2020-07-14 ENCOUNTER — Ambulatory Visit: Payer: Medicaid Other | Admitting: Podiatry

## 2020-07-21 ENCOUNTER — Ambulatory Visit: Payer: Medicaid Other | Admitting: Podiatry

## 2020-07-21 ENCOUNTER — Encounter: Payer: Self-pay | Admitting: Podiatry

## 2020-07-21 ENCOUNTER — Other Ambulatory Visit: Payer: Self-pay

## 2020-07-21 DIAGNOSIS — G5792 Unspecified mononeuropathy of left lower limb: Secondary | ICD-10-CM

## 2020-07-21 DIAGNOSIS — L989 Disorder of the skin and subcutaneous tissue, unspecified: Secondary | ICD-10-CM | POA: Diagnosis not present

## 2020-07-21 MED ORDER — HYDROCODONE-ACETAMINOPHEN 5-325 MG PO TABS: 1.0000 | ORAL_TABLET | Freq: Four times a day (QID) | ORAL | 0 refills | Status: DC | PRN

## 2020-07-28 NOTE — Progress Notes (Signed)
   Subjective: 34 y.o. male presenting to the office today for follow up evaluation of porokeratosis to the left lateral foot.  Patient states that he there is improvement to the callus area.  He has been applying OTC corn and callus remover daily under occlusion with a Band-Aid.  He has noticed significant improvement.  No new complaints at this time.  No past medical history on file.   Objective:  Physical Exam General: Alert and oriented x3 in no acute distress  Dermatology: Hyperkeratotic lesion(s) present on the left 5th metatarsal tubercle. Pain on palpation with a central nucleated core noted. Skin is warm, dry and supple bilateral lower extremities. Negative for open lesions or macerations.  Vascular: Palpable pedal pulses bilaterally. No edema or erythema noted. Capillary refill within normal limits.  Neurological: Epicritic and protective threshold grossly intact bilaterally.   Musculoskeletal Exam: Pain on palpation at the keratotic lesion(s) noted. Range of motion within normal limits bilateral. Muscle strength 5/5 in all groups bilateral.  Assessment: 1. 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 Patient evaluated 2. Excisional debridement of keratoic lesion using a chisel blade was performed without incident.  3.  Cantharone applied. Light dressing placed. 4.  Refill prescription for Vicodin 5/325 mg due to the pain 5.  Return to clinic in 2 weeks  Edrick Kins, DPM [facility]  Dr. Edrick Kins, [address] [place]                                        [place], [place] [address]                Office [phone]  Fax [phone]

## 2020-08-04 ENCOUNTER — Encounter: Payer: Self-pay | Admitting: Podiatry

## 2020-08-04 ENCOUNTER — Other Ambulatory Visit: Payer: Self-pay

## 2020-08-04 ENCOUNTER — Ambulatory Visit: Payer: Medicaid Other | Admitting: Podiatry

## 2020-08-04 DIAGNOSIS — L989 Disorder of the skin and subcutaneous tissue, unspecified: Secondary | ICD-10-CM

## 2020-08-04 MED ORDER — HYDROCODONE-ACETAMINOPHEN 5-325 MG PO TABS: 1.0000 | ORAL_TABLET | Freq: Four times a day (QID) | ORAL | 0 refills | Status: DC | PRN

## 2020-08-04 NOTE — Progress Notes (Signed)
   Subjective: 34 y.o. male presenting to the office today for follow up evaluation of porokeratosis to the left lateral foot.  Patient states that he there is improvement to the callus area.  Last visit on 07/21/2020 Cantharone was applied.  Was very sore and tender today.  No new complaints at this time  No past medical history on file.   Objective:  Physical Exam General: Alert and oriented x3 in no acute distress  Dermatology: Hyperkeratotic lesion(s) present on the left 5th metatarsal tubercle. Pain on palpation with a central nucleated core noted. Skin is warm, dry and supple bilateral lower extremities. Negative for open lesions or macerations.  Vascular: Palpable pedal pulses bilaterally. No edema or erythema noted. Capillary refill within normal limits.  Neurological: Epicritic and protective threshold grossly intact bilaterally.   Musculoskeletal Exam: Pain on palpation at the keratotic lesion(s) noted. Range of motion within normal limits bilateral. Muscle strength 5/5 in all groups bilateral.  Assessment: 1. 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 Patient evaluated 2. Excisional debridement of keratoic lesion using a chisel blade was performed without incident.  3.  The patient has now dealt with this symptomatic callus for over 1 year now.  There are times where the callus improved however it quickly regresses and becomes very symptomatic and painful.  I do believe that we are at the point where complete excision should be attempted. 4. Today we discussed the conservative versus surgical management of the presenting pathology. The patient opts for surgical management. All possible complications and details of the procedure were explained. All patient questions were answered. No guarantees were expressed or implied. 3. Authorization for surgery was initiated today. Surgery will consist of excision of benign lesion left foot performed in the office 4.   Refill prescription for Vicodin 5/325 mg  5.  Return to clinic morning of in office procedure in [facility], DPM [facility]  Dr. Edrick Kins, [address]                                        [place], [place] [address]                Office [phone]  Fax [phone]

## 2020-08-04 NOTE — Patient Instructions (Signed)
Pre-Operative Instructions  Congratulations, you have decided to take an important step towards improving your quality of life.  You can be assured that the doctors and staff at [facility] will be with you every step of the way.  Here are some important things you should know:  1. Plan to be at the surgery center/hospital at least 1 (one) hour prior to your scheduled time, unless otherwise directed by the surgical center/hospital staff.  You must have a responsible adult accompany you, remain during the surgery and drive you home.  Make sure you have directions to the surgical center/hospital to ensure you arrive on time. 2. If you are having surgery at [facility] or [facility] hospitals, you will need a copy of your medical history and physical form from your family physician within one month prior to the date of surgery. We will give you a form for your primary physician to complete.  3. We make every effort to accommodate the date you request for surgery.  However, there are times where surgery dates or times have to be moved.  We will contact you as soon as possible if a change in schedule is required.   4. No aspirin/ibuprofen for one week before surgery.  If you are on aspirin, any non-steroidal anti-inflammatory medications (Mobic, Aleve, Ibuprofen) should not be taken seven (7) days prior to your surgery.  You make take Tylenol for pain prior to surgery.  5. Medications - If you are taking daily heart and blood pressure medications, seizure, reflux, allergy, asthma, anxiety, pain or diabetes medications, make sure you notify the surgery center/hospital before the day of surgery so they can tell you which medications you should take or avoid the day of surgery. 6. No food or drink after midnight the night before surgery unless directed otherwise by surgical center/hospital staff. 7. No alcoholic beverages 24-hours prior to surgery.  No smoking 24-hours prior or 24-hours after  surgery. 8. Wear loose pants or shorts. They should be loose enough to fit over bandages, boots, and casts. 9. Don't wear slip-on shoes. Sneakers are preferred. 10. Bring your boot with you to the surgery center/hospital.  Also bring crutches or a walker if your physician has prescribed it for you.  If you do not have this equipment, it will be provided for you after surgery. 11. If you have not been contacted by the surgery center/hospital by the day before your surgery, call to confirm the date and time of your surgery. 12. Leave-time from work may vary depending on the type of surgery you have.  Appropriate arrangements should be made prior to surgery with your employer. 13. Prescriptions will be provided immediately following surgery by your doctor.  Fill these as soon as possible after surgery and take the medication as directed. Pain medications will not be refilled on weekends and must be approved by the doctor. 14. Remove nail polish on the operative foot and avoid getting pedicures prior to surgery. 15. Wash the night before surgery.  The night before surgery wash the foot and leg well with water and the antibacterial soap provided. Be sure to pay special attention to beneath the toenails and in between the toes.  Wash for at least three (3) minutes. Rinse thoroughly with water and dry well with a towel.  Perform this wash unless told not to do so by your physician.  Enclosed: 1 Ice pack (please put in freezer the night before surgery)   1 Hibiclens skin cleaner     Pre-op instructions  If you have any questions regarding the instructions, please do not hesitate to call our office.  [place]: [address], [place], [place] [address] -- [phone]  [place]: [address] , [place] [address] -- [phone]  [place]: [address], [place], [place] [address] -- [phone]   Website: [email or web address] 

## 2020-08-07 ENCOUNTER — Telehealth: Payer: Self-pay

## 2020-08-07 NOTE — Telephone Encounter (Signed)
Received surgery paperwork from the [place] office. Left a message for Jemell to call and schedule surgery.

## 2020-08-23 ENCOUNTER — Ambulatory Visit (INDEPENDENT_AMBULATORY_CARE_PROVIDER_SITE_OTHER): Payer: Medicaid Other | Admitting: Podiatry

## 2020-08-23 ENCOUNTER — Telehealth: Payer: Self-pay

## 2020-08-23 ENCOUNTER — Other Ambulatory Visit: Payer: Self-pay

## 2020-08-23 DIAGNOSIS — L989 Disorder of the skin and subcutaneous tissue, unspecified: Secondary | ICD-10-CM

## 2020-08-23 DIAGNOSIS — D492 Neoplasm of unspecified behavior of bone, soft tissue, and skin: Secondary | ICD-10-CM

## 2020-08-23 MED ORDER — OXYCODONE-ACETAMINOPHEN 5-325 MG PO TABS: 1.0000 | ORAL_TABLET | Freq: Four times a day (QID) | ORAL | 0 refills | Status: DC | PRN

## 2020-08-23 NOTE — Progress Notes (Signed)
   OPERATIVE REPORT Patient name: Jeffrey Gardner MRN: [id number] DOB: 1986/09/25  DOS:  08/23/20  Preop Dx: benign skin lesion left foot Postop Dx: same  Procedure:  1.  Excision of benign skin lesion left foot  Surgeon: Edrick Kins DPM  Anesthesia: 50-50 mixture of 2% lidocaine with epinephrine totaling 6 mL infiltrated in the patient's left foot   Hemostasis: None  EBL: Minimal mL Materials: None Injectables: None Pathology: None  Condition: The patient tolerated the procedure and anesthesia well. No complications noted or reported   Justification for procedure: The patient is a 34 y.o. male who presents today for surgical correction of a symptomatic lesion to the left foot has been ongoing for over 1 year now.. All conservative modalities of been unsuccessful in providing any sort of satisfactory alleviation of symptoms with the patient. The patient was told benefits as well as possible side effects of the surgery. The patient consented for surgical correction. The patient consent form was reviewed. All patient questions were answered. No guarantees were expressed or implied.  Procedure in Detail: The patient was brought to the procedure room, placed in the procedure chair in the supine position at which time an aseptic scrub and drape were performed about the patient's respective lower extremity after local anesthesia was induced as described above. Attention was then directed to the surgical area where procedure number one commenced.  Procedure #1: Excision of benign skin lesion left foot A 2 cm ellipse was planned and made about the skin lesion on the left foot.  The elliptical skin incision was carried down to level subcutaneous tissue and the entire skin lesion and surrounding tissue was removed in toto.  Elliptical skin wedge was removed.  Evaluation indicated that all pathology and pathologic/symptomatic tissue was removed in toto.  Irrigation was utilized and primary closure  was obtained using 3-0 nylon suture. Dry sterile compressive dressings were then applied to all previously mentioned incision sites about the patient's lower extremity.   The patient was then discharged with prescriptions for adequate analgesia. the patient is to keep the dressings clean dry and intact until they are to follow surgeon Dr. Daylene Katayama in the office upon discharge.   Edrick Kins, DPM [facility]  Dr. Edrick Kins, DPM    [address], [place] [address]                Office [phone]  Fax [phone]

## 2020-08-23 NOTE — Telephone Encounter (Signed)
Pt had surgery this morning with Dr. Amalia Hailey. Pt states that the [facility] pharmacy stated that Dr. Amalia Hailey had too sign off on his medication. Pt also included that his pain meds would take at least 2-3 days to receive. Please advise.

## 2020-09-01 ENCOUNTER — Ambulatory Visit (INDEPENDENT_AMBULATORY_CARE_PROVIDER_SITE_OTHER): Payer: Medicaid Other | Admitting: Podiatry

## 2020-09-01 ENCOUNTER — Other Ambulatory Visit: Payer: Self-pay

## 2020-09-01 ENCOUNTER — Encounter: Payer: Self-pay | Admitting: Podiatry

## 2020-09-01 VITALS — BP 139/97 | HR 95 | Temp 98.3°F

## 2020-09-01 DIAGNOSIS — Z9889 Other specified postprocedural states: Secondary | ICD-10-CM

## 2020-09-01 DIAGNOSIS — L989 Disorder of the skin and subcutaneous tissue, unspecified: Secondary | ICD-10-CM

## 2020-09-01 MED ORDER — OXYCODONE-ACETAMINOPHEN 5-325 MG PO TABS: 1.0000 | ORAL_TABLET | Freq: Four times a day (QID) | ORAL | 0 refills | Status: DC | PRN

## 2020-09-01 NOTE — Progress Notes (Signed)
   Subjective:  Patient presents today status post excision of benign soft tissue lesion to the left fifth metatarsal tubercle performed in the office 08/23/2020.  Patient states that he is doing very well.  The pain is controlled with pain medication.  He is kept the dressings clean dry and intact as instructed.  No new complaints at this time   No past medical history on file.    Objective/Physical Exam Neurovascular status intact.  Skin incisions appear to be well coapted with sutures intact. No sign of infectious process noted. No dehiscence. No active bleeding noted.  Negative for any significant edema noted to the surgical extremity.  Assessment: 1. s/p excision benign skin lesion left in office procedure. DOS: 08/23/2020   Plan of Care:  1. Patient was evaluated.  2. dressings applied today. 3.  Recommend Ace wrap daily with Silvadene antibiotic cream 4.  Continue weightbearing in the postsurgical shoe as tolerated 5.  Return to clinic in 2 weeks for suture removal   Edrick Kins, DPM [facility]  Dr. Edrick Kins, DPM    [address], [place] [address]                Office [phone]  Fax [phone]

## 2020-09-15 ENCOUNTER — Encounter: Payer: Self-pay | Admitting: Podiatry

## 2020-09-15 ENCOUNTER — Other Ambulatory Visit: Payer: Self-pay

## 2020-09-15 ENCOUNTER — Ambulatory Visit (INDEPENDENT_AMBULATORY_CARE_PROVIDER_SITE_OTHER): Payer: Medicaid Other | Admitting: Podiatry

## 2020-09-15 DIAGNOSIS — Z9889 Other specified postprocedural states: Secondary | ICD-10-CM

## 2020-09-15 DIAGNOSIS — L989 Disorder of the skin and subcutaneous tissue, unspecified: Secondary | ICD-10-CM

## 2020-09-15 MED ORDER — HYDROCODONE-ACETAMINOPHEN 5-325 MG PO TABS: 1.0000 | ORAL_TABLET | Freq: Four times a day (QID) | ORAL | 0 refills | Status: DC | PRN

## 2020-09-15 NOTE — Progress Notes (Signed)
   Subjective:  Patient presents today status post excision of benign soft tissue lesion to the left fifth metatarsal tubercle performed in the office 08/23/2020.  Patient is doing well.  He continues to have achiness and pain to the surgical area.  No past medical history on file.    Objective/Physical Exam Neurovascular status intact.  Skin incisions appear to be well coapted with sutures intact. No sign of infectious process noted. No dehiscence. No active bleeding noted.  Negative for any significant edema noted to the surgical extremity.  Assessment: 1. s/p excision benign skin lesion left in office procedure. DOS: 08/23/2020   Plan of Care:  1. Patient was evaluated.  2.  Sutures removed today. 3.  Recommend OTC Voltaren gel daily 4.  Patient may begin washing and showering and getting the foot wet.  Recommend good supportive shoes/sneakers 5.  Refill prescription for Vicodin 5/325 mg #30 6.  Return to clinic in 1 month for final follow-up  Edrick Kins, DPM [facility]  Dr. Edrick Kins, DPM    [address], [place] [address]                Office [phone]  Fax [phone]

## 2020-10-20 ENCOUNTER — Encounter: Payer: Self-pay | Admitting: Podiatry

## 2020-10-20 ENCOUNTER — Ambulatory Visit (INDEPENDENT_AMBULATORY_CARE_PROVIDER_SITE_OTHER): Payer: Medicaid Other | Admitting: Podiatry

## 2020-10-20 ENCOUNTER — Other Ambulatory Visit: Payer: Self-pay

## 2020-10-20 DIAGNOSIS — L989 Disorder of the skin and subcutaneous tissue, unspecified: Secondary | ICD-10-CM

## 2020-10-20 DIAGNOSIS — Z9889 Other specified postprocedural states: Secondary | ICD-10-CM

## 2020-10-20 MED ORDER — HYDROCODONE-ACETAMINOPHEN 5-325 MG PO TABS: 1.0000 | ORAL_TABLET | Freq: Four times a day (QID) | ORAL | 0 refills | Status: DC | PRN

## 2020-10-20 NOTE — Progress Notes (Signed)
   Subjective:  Patient presents today status post excision of benign soft tissue lesion to the left fifth metatarsal tubercle performed in the office 08/23/2020.  Patient is doing well.  Patient notices significant improvement.  He continues to have some achiness intermittently. No past medical history on file.    Objective/Physical Exam Neurovascular status intact.  Skin incisions appear to be well coapted and healed.  There is some slight hyperkeratotic callus tissue noted.  No sign of infectious process noted. No dehiscence. No active bleeding noted.  Negative for any significant edema noted to the surgical extremity.  Assessment: 1. s/p excision benign skin lesion left in office procedure. DOS: 08/23/2020   Plan of Care:  1. Patient was evaluated.  2. Refill prescription for Vicodin 5/325 mg #30 3.  Light debridement performed of the callused area. 4.  Continue wearing good supportive sneakers and shoes 5.  Patient may resume full activity no restrictions 6.  Return to clinic as needed  Edrick Kins, DPM [facility]  Dr. Edrick Kins, DPM    [address], [place] [address]                Office [phone]  Fax [phone]

## 2021-01-16 ENCOUNTER — Ambulatory Visit: Payer: Medicaid Other | Admitting: Podiatry

## 2021-01-26 ENCOUNTER — Other Ambulatory Visit: Payer: Self-pay

## 2021-01-26 ENCOUNTER — Ambulatory Visit (INDEPENDENT_AMBULATORY_CARE_PROVIDER_SITE_OTHER): Payer: Medicaid Other | Admitting: Podiatry

## 2021-01-26 ENCOUNTER — Encounter: Payer: Self-pay | Admitting: Podiatry

## 2021-01-26 DIAGNOSIS — B070 Plantar wart: Secondary | ICD-10-CM | POA: Diagnosis not present

## 2021-01-26 DIAGNOSIS — D492 Neoplasm of unspecified behavior of bone, soft tissue, and skin: Secondary | ICD-10-CM | POA: Diagnosis not present

## 2021-01-26 MED ORDER — HYDROCODONE-ACETAMINOPHEN 5-325 MG PO TABS: 1.0000 | ORAL_TABLET | Freq: Three times a day (TID) | ORAL | 0 refills | Status: DC | PRN

## 2021-01-26 MED ORDER — FLUOROURACIL 5 % EX CREA: TOPICAL_CREAM | Freq: Two times a day (BID) | CUTANEOUS | 1 refills | Status: DC

## 2021-01-26 NOTE — Progress Notes (Signed)
   Subjective: 35 y.o. male presenting today for evaluation of left foot pain has been going on for the past month.  Patient states that it is causing pain even in her shoes.  He notices a skin lesion developing to the plantar aspect of the foot.   No past medical history on file.  Objective: Physical Exam General: The patient is alert and oriented x3 in no acute distress.   Dermatology: Hyperkeratotic skin lesion(s) noted to the plantar aspect of the left foot approximately 1 cm in diameter. Pinpoint bleeding noted upon debridement. Skin is warm, dry and supple bilateral lower extremities. Negative for open lesions or macerations.   Vascular: Palpable pedal pulses bilaterally. No edema or erythema noted. Capillary refill within normal limits.   Neurological: Epicritic and protective threshold grossly intact bilaterally.    Musculoskeletal Exam: Pain on palpation to the noted skin lesion(s).  Range of motion within normal limits to all pedal and ankle joints bilateral. Muscle strength 5/5 in all groups bilateral.    Assessment: #1 plantar wart left foot   Plan of Care:  #1 Patient was evaluated. #2 Excisional debridement of the plantar wart lesion(s) was performed using a chisel blade. Salicylic acid was applied and the lesion(s) was dressed with a dry sterile dressing. #3 Rx for Efudex topical cream #4 patient is to return to clinic in 6 weeks  Edrick Kins, DPM [facility]  Dr. Edrick Kins, [address] [place]                                        [place], [place] [address]                Office [phone]  Fax [phone]

## 2021-02-02 ENCOUNTER — Telehealth: Payer: Self-pay | Admitting: Podiatry

## 2021-02-02 NOTE — Telephone Encounter (Signed)
Please advise 

## 2021-02-02 NOTE — Telephone Encounter (Signed)
Patient is requesting a refill on his pain medication. Patient states he was supposed to have a 30 day supply of HYDROcodone-acetaminophen (NORCO/VICODIN) 5-325 MG tablet but was only dispensed 15 pills. I informed patient that was the max number of pills to be dispensed at a time. Patient calling to request he get his other 15 pills, Please advise.

## 2021-03-09 ENCOUNTER — Encounter: Payer: Self-pay | Admitting: Podiatry

## 2021-03-09 ENCOUNTER — Other Ambulatory Visit: Payer: Self-pay

## 2021-03-09 ENCOUNTER — Ambulatory Visit (INDEPENDENT_AMBULATORY_CARE_PROVIDER_SITE_OTHER): Payer: Medicaid Other | Admitting: Podiatry

## 2021-03-09 DIAGNOSIS — L989 Disorder of the skin and subcutaneous tissue, unspecified: Secondary | ICD-10-CM

## 2021-03-09 DIAGNOSIS — B070 Plantar wart: Secondary | ICD-10-CM

## 2021-03-09 DIAGNOSIS — L0889 Other specified local infections of the skin and subcutaneous tissue: Secondary | ICD-10-CM | POA: Diagnosis not present

## 2021-03-09 MED ORDER — HYDROCODONE-ACETAMINOPHEN 5-325 MG PO TABS: 1.0000 | ORAL_TABLET | Freq: Three times a day (TID) | ORAL | 0 refills | Status: DC | PRN

## 2021-03-09 NOTE — Progress Notes (Signed)
   Subjective: 35 y.o. male presenting today for evaluation of left foot pain has been going on for the past month.  Patient states that it is causing pain even in her shoes.  He notices a skin lesion developing to the plantar aspect of the foot.   No past medical history on file.  Objective: Physical Exam General: The patient is alert and oriented x3 in no acute distress.   Dermatology: Hyperkeratotic skin lesion(s) noted to the plantar aspect of the left foot approximately 1 cm in diameter. Pinpoint bleeding noted upon debridement. Skin is warm, dry and supple bilateral lower extremities. Negative for open lesions or macerations.   Vascular: Palpable pedal pulses bilaterally. No edema or erythema noted. Capillary refill within normal limits.   Neurological: Epicritic and protective threshold grossly intact bilaterally.    Musculoskeletal Exam: Pain on palpation to the noted skin lesion(s).  Range of motion within normal limits to all pedal and ankle joints bilateral. Muscle strength 5/5 in all groups bilateral.    Assessment: #1 plantar wart left foot   Plan of Care:  #1 Patient was evaluated. #2 Excisional debridement of the plantar wart lesion(s) was performed using a chisel blade. Salicylic acid was applied and the lesion(s) was dressed with a dry sterile dressing. #3  Continue Efudex topical cream daily #4 patient is to return to clinic in 6 weeks  Edrick Kins, DPM [facility]  Dr. Edrick Kins, DPM    [address], [place] [address]                Office [phone]  Fax [phone]

## 2021-08-02 NOTE — Telephone Encounter (Signed)
Error

## 2021-10-11 ENCOUNTER — Emergency Department (HOSPITAL_COMMUNITY)
Admission: EM | Admit: 2021-10-11 | Discharge: 2021-10-11 | Disposition: A | Discharge status: Home / Self Care | Payer: Medicaid Other | Attending: Emergency Medicine | Admitting: Emergency Medicine

## 2021-10-11 ENCOUNTER — Encounter (HOSPITAL_COMMUNITY): Payer: Self-pay | Admitting: *Deleted

## 2021-10-11 DIAGNOSIS — M542 Cervicalgia: Secondary | ICD-10-CM | POA: Insufficient documentation

## 2021-10-11 DIAGNOSIS — X509XXA Other and unspecified overexertion or strenuous movements or postures, initial encounter: Secondary | ICD-10-CM | POA: Insufficient documentation

## 2021-10-11 DIAGNOSIS — S46812A Strain of other muscles, fascia and tendons at shoulder and upper arm level, left arm, initial encounter: Secondary | ICD-10-CM | POA: Diagnosis not present

## 2021-10-11 DIAGNOSIS — F17210 Nicotine dependence, cigarettes, uncomplicated: Secondary | ICD-10-CM | POA: Diagnosis not present

## 2021-10-11 DIAGNOSIS — S4992XA Unspecified injury of left shoulder and upper arm, initial encounter: Secondary | ICD-10-CM | POA: Diagnosis present

## 2021-10-11 MED ORDER — DICLOFENAC SODIUM 75 MG PO TBEC: 75.0000 mg | DELAYED_RELEASE_TABLET | Freq: Two times a day (BID) | ORAL | 0 refills | Status: DC

## 2021-10-11 MED ORDER — CYCLOBENZAPRINE HCL 10 MG PO TABS: 10.0000 mg | ORAL_TABLET | Freq: Three times a day (TID) | ORAL | 0 refills | Status: DC | PRN

## 2021-10-11 NOTE — Discharge Instructions (Signed)
Return if any problems.

## 2021-10-11 NOTE — ED Notes (Signed)
ED Provider at bedside. 

## 2021-10-11 NOTE — ED Triage Notes (Signed)
Left shoulder and neck pain, worse with movement

## 2021-10-11 NOTE — ED Provider Notes (Signed)
[facility] EMERGENCY DEPARTMENT Provider Note   CSN: [id number] Arrival date & time: 10/11/21  1311     History Chief Complaint  Patient presents with   Shoulder Pain    Jeffrey Gardner is a 35 y.o. male.  Pt report pain and tightness in neck and right shoulder,  Pt reports pain with moving.   The history is provided by the patient. No language interpreter was used.  Shoulder Pain Hand Pain This is a new problem. The current episode started yesterday. The problem occurs constantly. The problem has been gradually worsening. Nothing aggravates the symptoms.      History reviewed. No pertinent past medical history.  There are no problems to display for this patient.   Past Surgical History:  Procedure Laterality Date   FOOT SURGERY     HAND SURGERY         No family history on file.  Social History   Tobacco Use   Smoking status: Every Day    Packs/day: 0.50    Types: Cigarettes   Smokeless tobacco: Never  Vaping Use   Vaping Use: Never used  Substance Use Topics   Alcohol use: Yes    Comment: occ   Drug use: No    Home Medications Prior to Admission medications   Medication Sig Start Date End Date Taking? Authorizing Provider  cyclobenzaprine (FLEXERIL) 10 MG tablet Take 1 tablet (10 mg total) by mouth 3 (three) times daily as needed for muscle spasms. 10/11/21  Yes Fransico Meadow, PA-C  diclofenac (VOLTAREN) 75 MG EC tablet Take 1 tablet (75 mg total) by mouth 2 (two) times daily. 10/11/21  Yes Caryl Ada K, PA-C  meloxicam (MOBIC) 7.5 MG tablet Take 7.5 mg by mouth 2 (two) times daily as needed for pain. 08/27/21  Yes [provider]  Vitamin D, Ergocalciferol, (DRISDOL) 1.25 MG (50000 UNIT) CAPS capsule Take 50,000 Units by mouth once a week. 08/31/21  Yes [provider]  doxycycline (VIBRAMYCIN) 100 MG capsule Take 1 capsule (100 mg total) by mouth 2 (two) times daily. Patient not taking: Reported on 10/11/2021 11/10/18   Lily Kocher,  PA-C  erythromycin ophthalmic ointment Place a 1/2 inch ribbon of ointment into the lower eyelid 4 times daily for 5 days. Patient not taking: Reported on 10/11/2021 10/26/17   Frederica Kuster, PA-C  fluorouracil (EFUDEX) 5 % cream Apply topically 2 (two) times daily. Patient not taking: Reported on 10/11/2021 01/26/21   Edrick Kins, DPM  gabapentin (NEURONTIN) 100 MG capsule TAKE 1 CAPSULE BY MOUTH THREE TIMES A DAY Patient not taking: Reported on 10/11/2021 10/13/19   Edrick Kins, DPM  HYDROcodone-acetaminophen (NORCO/VICODIN) 5-325 MG tablet Take 1 tablet by mouth every 8 (eight) hours as needed for severe pain. Patient not taking: Reported on 10/11/2021 03/09/21   Edrick Kins, DPM  ibuprofen (ADVIL,MOTRIN) 600 MG tablet Take 1 tablet (600 mg total) by mouth every 8 (eight) hours as needed. Patient not taking: Reported on 10/11/2021 12/09/17   Sable Feil, PA-C  meloxicam (MOBIC) 15 MG tablet Take 1 tablet (15 mg total) by mouth daily. Patient not taking: Reported on 10/11/2021 07/24/17   Edrick Kins, DPM    Allergies    Patient has no known allergies.  Review of Systems   Review of Systems  Musculoskeletal:  Positive for myalgias. Negative for joint swelling.  All other systems reviewed and are negative.  Physical Exam Updated Vital Signs BP (!) 144/108 (BP Location:  Right Arm)   Pulse 86   Temp 98.4 F (36.9 C) (Oral)   Resp 20   SpO2 98%   Physical Exam Vitals and nursing note reviewed.  Constitutional:      General: He is not in acute distress.    Appearance: He is well-developed.  HENT:     Head: Normocephalic and atraumatic.  Eyes:     Conjunctiva/sclera: Conjunctivae normal.  Cardiovascular:     Rate and Rhythm: Normal rate.     Heart sounds: No murmur heard. Pulmonary:     Effort: Pulmonary effort is normal.  Musculoskeletal:        General: Tenderness present.     Cervical back: Neck supple. Tenderness present.     Comments: Ender left trapezius     Skin:    General: Skin is warm and dry.     Capillary Refill: Capillary refill takes less than 2 seconds.  Neurological:     Mental Status: He is alert.  Psychiatric:        Mood and Affect: Mood normal.    ED Results / Procedures / Treatments   Labs (all labs ordered are listed, but only abnormal results are displayed) Labs Reviewed - No data to display  EKG None  Radiology No results found.  Procedures Procedures   Medications Ordered in ED Medications - No data to display  ED Course  I have reviewed the triage vital signs and the nursing notes.  Pertinent labs & imaging results that were available during my care of the patient were reviewed by me and considered in my medical decision making (see chart for details).    MDM Rules/Calculators/A&P                           MDM:  Pt reports pain after lifting.  Pt had sudden pain and now sore,  Pt counseled on muscle strain.  Pt given rx for voltaren and flexeril  Final Clinical Impression(s) / ED Diagnoses Final diagnoses:  Trapezius muscle strain, left, initial encounter    Rx / DC Orders ED Discharge Orders          Ordered    diclofenac (VOLTAREN) 75 MG EC tablet  2 times daily        10/11/21 1344    cyclobenzaprine (FLEXERIL) 10 MG tablet  3 times daily PRN        10/11/21 1344          An After Visit Summary was printed and given to the patient.    Fransico Meadow, PA-C 10/11/21 [place], [place], DO 10/16/21 [phone]

## 2021-10-23 ENCOUNTER — Emergency Department (HOSPITAL_COMMUNITY)
Admission: EM | Admit: 2021-10-23 | Discharge: 2021-10-23 | Disposition: A | Discharge status: Home / Self Care | Payer: Medicaid Other | Attending: Emergency Medicine | Admitting: Emergency Medicine

## 2021-10-23 ENCOUNTER — Encounter (HOSPITAL_COMMUNITY): Payer: Self-pay | Admitting: Emergency Medicine

## 2021-10-23 ENCOUNTER — Emergency Department (HOSPITAL_COMMUNITY): Payer: Medicaid Other

## 2021-10-23 ENCOUNTER — Other Ambulatory Visit: Payer: Self-pay

## 2021-10-23 DIAGNOSIS — S20212A Contusion of left front wall of thorax, initial encounter: Secondary | ICD-10-CM | POA: Insufficient documentation

## 2021-10-23 DIAGNOSIS — J439 Emphysema, unspecified: Secondary | ICD-10-CM | POA: Diagnosis not present

## 2021-10-23 DIAGNOSIS — W228XXA Striking against or struck by other objects, initial encounter: Secondary | ICD-10-CM | POA: Diagnosis not present

## 2021-10-23 DIAGNOSIS — S299XXA Unspecified injury of thorax, initial encounter: Secondary | ICD-10-CM | POA: Diagnosis present

## 2021-10-23 DIAGNOSIS — F17210 Nicotine dependence, cigarettes, uncomplicated: Secondary | ICD-10-CM | POA: Insufficient documentation

## 2021-10-23 MED ORDER — METHOCARBAMOL 500 MG PO TABS: 500.0000 mg | ORAL_TABLET | Freq: Two times a day (BID) | ORAL | 0 refills | Status: DC

## 2021-10-23 MED ORDER — NAPROXEN 250 MG PO TABS
500.0000 mg | ORAL_TABLET | Freq: Once | ORAL | Status: AC
  Administered 2021-10-23: 08:00:00 500 mg via ORAL
  Filled 2021-10-23: qty 2

## 2021-10-23 MED ORDER — METHOCARBAMOL 500 MG PO TABS
500.0000 mg | ORAL_TABLET | Freq: Once | ORAL | Status: AC
  Administered 2021-10-23: 08:00:00 500 mg via ORAL
  Filled 2021-10-23: qty 1

## 2021-10-23 MED ORDER — NAPROXEN 500 MG PO TABS: 500.0000 mg | ORAL_TABLET | Freq: Two times a day (BID) | ORAL | 0 refills | Status: DC

## 2021-10-23 NOTE — ED Notes (Signed)
Pt verbalized understanding of discharge paperwork.

## 2021-10-23 NOTE — ED Triage Notes (Signed)
Pt c/o right rib pain after couch hit right side of chest yesterday while moving.

## 2021-10-23 NOTE — Discharge Instructions (Signed)
As discussed, the x-rays do not show any evidence of rib fractures or collapsed lung.  You most likely have bruised ribs and chest wall muscles, the symptoms will get better over the course of the next 5 to 7 days with anti-inflammatory medications.  Take muscle relaxants as needed.  The x-ray also revealed evidence of emphysema.  Please read instructions provided on COPD, and we recommend that he stop smoking as soon as possible.

## 2021-10-23 NOTE — ED Provider Notes (Signed)
[facility] EMERGENCY DEPARTMENT Provider Note   CSN: [id number] Arrival date & time: 10/23/21  5859     History Chief Complaint  Patient presents with   Rib Injury    Jeffrey Gardner is a 35 y.o. male.  HPI     35 year old male comes in with chief complaint of rib injury. Patient reports yesterday he was helping out, and the leg of the house ended up striking him on the right side of his ribs.  Patient has been having excruciating pain since then.  He is unable to sleep well because of the pain.  Pain is worse with deep inspiration.  History reviewed. No pertinent past medical history.  There are no problems to display for this patient.   Past Surgical History:  Procedure Laterality Date   FOOT SURGERY     HAND SURGERY         No family history on file.  Social History   Tobacco Use   Smoking status: Every Day    Packs/day: 0.50    Types: Cigarettes   Smokeless tobacco: Never  Vaping Use   Vaping Use: Never used  Substance Use Topics   Alcohol use: Yes    Comment: occ   Drug use: No    Home Medications Prior to Admission medications   Medication Sig Start Date End Date Taking? Authorizing Provider  methocarbamol (ROBAXIN) 500 MG tablet Take 1 tablet (500 mg total) by mouth 2 (two) times daily. 10/23/21  Yes Velma Hanna, Thelma Comp, MD  naproxen (NAPROSYN) 500 MG tablet Take 1 tablet (500 mg total) by mouth 2 (two) times daily. 10/23/21  Yes Varney Biles, MD  doxycycline (VIBRAMYCIN) 100 MG capsule Take 1 capsule (100 mg total) by mouth 2 (two) times daily. Patient not taking: Reported on 10/11/2021 11/10/18   Lily Kocher, PA-C  erythromycin ophthalmic ointment Place a 1/2 inch ribbon of ointment into the lower eyelid 4 times daily for 5 days. Patient not taking: Reported on 10/11/2021 10/26/17   Frederica Kuster, PA-C  fluorouracil (EFUDEX) 5 % cream Apply topically 2 (two) times daily. Patient not taking: Reported on 10/11/2021 01/26/21   Edrick Kins, DPM   gabapentin (NEURONTIN) 100 MG capsule TAKE 1 CAPSULE BY MOUTH THREE TIMES A DAY Patient not taking: Reported on 10/11/2021 10/13/19   Edrick Kins, DPM  HYDROcodone-acetaminophen (NORCO/VICODIN) 5-325 MG tablet Take 1 tablet by mouth every 8 (eight) hours as needed for severe pain. Patient not taking: Reported on 10/11/2021 03/09/21   Edrick Kins, DPM  ibuprofen (ADVIL,MOTRIN) 600 MG tablet Take 1 tablet (600 mg total) by mouth every 8 (eight) hours as needed. Patient not taking: Reported on 10/11/2021 12/09/17   Sable Feil, PA-C  meloxicam (MOBIC) 7.5 MG tablet Take 7.5 mg by mouth 2 (two) times daily as needed for pain. 08/27/21   [provider]  Vitamin D, Ergocalciferol, (DRISDOL) 1.25 MG (50000 UNIT) CAPS capsule Take 50,000 Units by mouth once a week. 08/31/21   [provider]    Allergies    Patient has no known allergies.  Review of Systems   Review of Systems  Constitutional:  Positive for activity change.  Respiratory:  Negative for cough and shortness of breath.   Cardiovascular:  Positive for chest pain.  Hematological:  Does not bruise/bleed easily.   Physical Exam Updated Vital Signs BP (!) 130/97    Pulse 73    Temp 98.5 F (36.9 C) (Oral)    Resp 16  Ht 5\' 9"  (1.753 m)    Wt 68.9 kg    SpO2 95%    BMI 22.45 kg/m   Physical Exam Vitals and nursing note reviewed.  Constitutional:      Appearance: He is well-developed.  HENT:     Head: Atraumatic.  Cardiovascular:     Rate and Rhythm: Normal rate.  Pulmonary:     Effort: Pulmonary effort is normal.     Breath sounds: Normal breath sounds.  Musculoskeletal:     Cervical back: Neck supple.  Skin:    General: Skin is warm.  Neurological:     Mental Status: He is alert and oriented to person, place, and time.    ED Results / Procedures / Treatments   Labs (all labs ordered are listed, but only abnormal results are displayed) Labs Reviewed - No data to  display  EKG None  Radiology DG Ribs Unilateral W/Chest Right  Result Date: 10/23/2021 CLINICAL DATA:  35 year old male with right anterolateral rib pain after heavy lifting yesterday. Smoker. EXAM: RIGHT RIBS AND CHEST - 3+ VIEW COMPARISON:  Thoracic spine radiographs 08/18/2018. FINDINGS: Evidence of chronic bullous emphysema in the right lung, lung markings there appear stable since 2019. Background lung volumes are within normal limits. Visualized tracheal air column is within normal limits. Normal cardiac size and mediastinal contours. No pneumothorax, pulmonary edema, pleural effusion or confluent pulmonary opacity. Negative visible bowel gas. Bone mineralization is within normal limits. Right anterior 7th rib level marked as the area of clinical concern. No right rib fracture identified. Other visible osseous structures appear intact. IMPRESSION: 1. No right rib fracture identified radiographically. 2. 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 No acute cardiopulmonary abnormality. Electronically Signed   By: Genevie Ann M.D.   On: 10/23/2021 07:27    Procedures Procedures   Medications Ordered in ED Medications  naproxen (NAPROSYN) tablet 500 mg (has no administration in time range)  methocarbamol (ROBAXIN) tablet 500 mg (has no administration in time range)    ED Course  I have reviewed the triage vital signs and the nursing notes.  Pertinent labs & imaging results that were available during my care of the patient were reviewed by me and considered in my medical decision making (see chart for details).    MDM Rules/Calculators/A&P                          35 year old male comes in with chief complaint of chest pain. Patient's chest pain is secondary to blunt trauma that he experienced yesterday.  Patient has bilateral breath sounds.  He is not complaining of dyspnea.  And x-rays of the ribs did not reveal any evidence of pneumothorax or rib fracture.  X-ray does not  indicate signs of COPD/emphysema.  I have reviewed the x-rays independently and discussed the findings with the patient.  He does indicate that he is a heavy smoker.  Smoking cessation discussion completed. Patient has been also prescribe anti-inflammatory and muscle relaxants for symptom management.   Smoking cessation instruction/counseling given:  counseled patient on the dangers of tobacco use, advised patient to stop smoking, and reviewed strategies to maximize success    Final Clinical Impression(s) / ED Diagnoses Final diagnoses:  Rib contusion, left, initial encounter  Pulmonary emphysema, unspecified emphysema type ([place])    Rx / DC Orders ED Discharge Orders          Ordered    methocarbamol (ROBAXIN) 500 MG tablet  2  times daily        10/23/21 0749    naproxen (NAPROSYN) 500 MG tablet  2 times daily        10/23/21 0749             Varney Biles, MD 10/23/21 [phone]

## 2021-11-15 ENCOUNTER — Emergency Department (HOSPITAL_COMMUNITY)
Admission: EM | Admit: 2021-11-15 | Discharge: 2021-11-15 | Disposition: A | Discharge status: Home / Self Care | Payer: Medicaid Other | Attending: Emergency Medicine | Admitting: Emergency Medicine

## 2021-11-15 ENCOUNTER — Emergency Department (HOSPITAL_COMMUNITY): Payer: Medicaid Other

## 2021-11-15 ENCOUNTER — Encounter (HOSPITAL_COMMUNITY): Payer: Self-pay

## 2021-11-15 ENCOUNTER — Other Ambulatory Visit: Payer: Self-pay

## 2021-11-15 DIAGNOSIS — S6991XA Unspecified injury of right wrist, hand and finger(s), initial encounter: Secondary | ICD-10-CM | POA: Diagnosis present

## 2021-11-15 DIAGNOSIS — S62340A Nondisplaced fracture of base of second metacarpal bone, right hand, initial encounter for closed fracture: Secondary | ICD-10-CM | POA: Insufficient documentation

## 2021-11-15 DIAGNOSIS — W109XXA Fall (on) (from) unspecified stairs and steps, initial encounter: Secondary | ICD-10-CM | POA: Diagnosis not present

## 2021-11-15 DIAGNOSIS — Y93K1 Activity, walking an animal: Secondary | ICD-10-CM | POA: Insufficient documentation

## 2021-11-15 DIAGNOSIS — S62300A Unspecified fracture of second metacarpal bone, right hand, initial encounter for closed fracture: Secondary | ICD-10-CM

## 2021-11-15 MED ORDER — OXYCODONE HCL 5 MG PO TABS: 5.0000 mg | ORAL_TABLET | Freq: Three times a day (TID) | ORAL | 0 refills | Status: AC | PRN

## 2021-11-15 MED ORDER — OXYCODONE HCL 5 MG PO TABS
5.0000 mg | ORAL_TABLET | Freq: Three times a day (TID) | ORAL | 0 refills | Status: DC | PRN
Start: 2021-11-15 — End: 2021-11-15

## 2021-11-15 MED ORDER — OXYCODONE HCL 5 MG PO TABS
5.0000 mg | ORAL_TABLET | Freq: Once | ORAL | Status: AC
  Administered 2021-11-15: 5 mg via ORAL
  Filled 2021-11-15: qty 1

## 2021-11-15 NOTE — Discharge Instructions (Signed)
Jeffrey Gardner  You were seen at [facility] Emergency Department for hand pain. We took Xrays which showed a fracture in your hand. You should schedule an appointment to follow up with the orthopedic doctors this week.

## 2021-11-15 NOTE — ED Triage Notes (Signed)
Pt presents to ED with complaints of right hand pain after fall this am.

## 2021-11-15 NOTE — ED Provider Notes (Signed)
[facility] Provider Note   CSN: [id number] Arrival date & time: 11/15/21  8366     History History reviewed. No pertinent past medical history.  Chief Complaint  Patient presents with   Hand Pain    Jeffrey Gardner is a 36 y.o. male.  Patient is a 36 y.o. Male who was is coming in with hand pain and swelling. He was walking his dog and walking down some stairs when he tripped and fell on his right hand. He says that pain is still pretty bad but he is able to move his hand, just pain limited. Remembers the entire fall, did not hit his head, and did not lose consciousness.   Hand Pain      Home Medications Prior to Admission medications   Medication Sig Start Date End Date Taking? Authorizing Provider  doxycycline (VIBRAMYCIN) 100 MG capsule Take 1 capsule (100 mg total) by mouth 2 (two) times daily. Patient not taking: Reported on 10/11/2021 11/10/18   Lily Kocher, PA-C  erythromycin ophthalmic ointment Place a 1/2 inch ribbon of ointment into the lower eyelid 4 times daily for 5 days. Patient not taking: Reported on 10/11/2021 10/26/17   Frederica Kuster, PA-C  fluorouracil (EFUDEX) 5 % cream Apply topically 2 (two) times daily. Patient not taking: Reported on 10/11/2021 01/26/21   Edrick Kins, DPM  gabapentin (NEURONTIN) 100 MG capsule TAKE 1 CAPSULE BY MOUTH THREE TIMES A DAY Patient not taking: Reported on 10/11/2021 10/13/19   Edrick Kins, DPM  HYDROcodone-acetaminophen (NORCO/VICODIN) 5-325 MG tablet Take 1 tablet by mouth every 8 (eight) hours as needed for severe pain. Patient not taking: Reported on 10/11/2021 03/09/21   Edrick Kins, DPM  ibuprofen (ADVIL,MOTRIN) 600 MG tablet Take 1 tablet (600 mg total) by mouth every 8 (eight) hours as needed. Patient not taking: Reported on 10/11/2021 12/09/17   Sable Feil, PA-C  meloxicam (MOBIC) 7.5 MG tablet Take 7.5 mg by mouth 2 (two) times daily as needed for pain. 08/27/21   [provider]   methocarbamol (ROBAXIN) 500 MG tablet Take 1 tablet (500 mg total) by mouth 2 (two) times daily. 10/23/21   Varney Biles, MD  naproxen (NAPROSYN) 500 MG tablet Take 1 tablet (500 mg total) by mouth 2 (two) times daily. 10/23/21   Varney Biles, MD  Vitamin D, Ergocalciferol, (DRISDOL) 1.25 MG (50000 UNIT) CAPS capsule Take 50,000 Units by mouth once a week. 08/31/21   [provider]      Allergies    Patient has no known allergies.    Review of Systems   Review of Systems  Musculoskeletal:  Positive for joint swelling.  Neurological:  Negative for syncope.  All other systems reviewed and are negative.  Physical Exam Updated Vital Signs BP (!) 132/96 (BP Location: Right Arm)    Pulse 98    Temp 98.2 F (36.8 C) (Oral)    Resp 18    Ht 5\' 9"  (1.753 m)    Wt 69.4 kg    SpO2 96%    BMI 22.59 kg/m  Physical Exam Constitutional:      Appearance: Normal appearance.  HENT:     Head: Normocephalic and atraumatic.  Eyes:     Extraocular Movements: Extraocular movements intact.  Cardiovascular:     Rate and Rhythm: Normal rate and regular rhythm.  Pulmonary:     Effort: Pulmonary effort is normal.  Musculoskeletal:     Left wrist: Normal.  Right hand: Swelling and tenderness present. No lacerations. Normal sensation. Normal pulse.     Left hand: Normal.  Neurological:     Mental Status: He is alert.    ED Results / Procedures / Treatments   Labs (all labs ordered are listed, but only abnormal results are displayed) Labs Reviewed - No data to display  EKG None  Radiology DG Hand Complete Right  Result Date: 11/15/2021 CLINICAL DATA:  Right hand pain after fall today. EXAM: RIGHT HAND - COMPLETE 3+ VIEW COMPARISON:  December 09, 2017. FINDINGS: Deformity of the distal second metacarpal is noted consistent with old fracture. However, there appears to be a linear lucency in this area that is concerning for acute nondisplaced fracture. No other bony abnormality is  noted. Joint spaces are intact. IMPRESSION: Probable acute nondisplaced fracture involving the distal second metacarpal in the region of previous old fracture. Electronically Signed   By: Marijo Conception M.D.   On: 11/15/2021 08:18    Procedures Procedures    Medications Ordered in ED Medications  oxyCODONE (Oxy IR/ROXICODONE) immediate release tablet 5 mg (5 mg Oral Given 11/15/21 5456)    ED Course/ Medical Decision Making/ A&P                           Medical Decision Making  Patient has obvious swelling after a fall down stairs of his right hand. Normal pulses on exam, range of motion limited by pain. No breaks in skin or laceration. XR hand reviewed by me showed acute nondisplaced fracture involving the distal second metacarpal in the region of previous old fracture. Patient will be put in a thumb spica splint and discharged with follow up with orthopedic surgery.   Final Clinical Impression(s) / ED Diagnoses Final diagnoses:  Closed nondisplaced fracture of second metacarpal bone of right hand, unspecified portion of metacarpal, initial encounter    Rx / DC Orders ED Discharge Orders     None         Scarlett Presto, MD 11/15/21 2563    Isla Pence, MD 11/15/21 1530

## 2021-11-21 ENCOUNTER — Ambulatory Visit (INDEPENDENT_AMBULATORY_CARE_PROVIDER_SITE_OTHER): Payer: Medicaid Other | Admitting: Orthopedic Surgery

## 2021-11-21 ENCOUNTER — Ambulatory Visit: Payer: Medicaid Other

## 2021-11-21 ENCOUNTER — Encounter: Payer: Self-pay | Admitting: Orthopedic Surgery

## 2021-11-21 ENCOUNTER — Other Ambulatory Visit: Payer: Self-pay

## 2021-11-21 VITALS — BP 146/92 | HR 99 | Ht 69.0 in | Wt 151.0 lb

## 2021-11-21 DIAGNOSIS — S62360A Nondisplaced fracture of neck of second metacarpal bone, right hand, initial encounter for closed fracture: Secondary | ICD-10-CM

## 2021-11-21 DIAGNOSIS — M79641 Pain in right hand: Secondary | ICD-10-CM

## 2021-11-21 MED ORDER — HYDROCODONE-ACETAMINOPHEN 5-325 MG PO TABS
1.0000 | ORAL_TABLET | Freq: Four times a day (QID) | ORAL | 0 refills | Status: DC | PRN
Start: 2021-11-21 — End: 2024-01-15

## 2021-11-21 NOTE — Progress Notes (Signed)
New Patient Visit  Assessment: Jeffrey Gardner is a 36 y.o. male with the following: 1. Closed nondisplaced fracture of neck of second metacarpal bone of right hand, initial encounter  Plan: Repeat radiographs demonstrate near anatomic alignment.  We will continue with nonoperative management.  He was placed in a radial gutter cast in clinic today.  We will see him back in 2 weeks for repeat evaluation, including new x-rays.  Plan to transition into another cast, for approximately 4 weeks of immobilization.  Provided him with another prescription for hydrocodone.  We will continue to wean off narcotics.  Cast application - right short arm cast -radial gutter   Verbal consent was obtained and the correct extremity was identified. A well padded, appropriately molded short arm cast was applied to the right arm Fingers remained warm and well perfused.   There were no sharp edges Patient tolerated the procedure well Cast care instructions were provided     Follow-up: Return in about 2 weeks (around 12/05/2021).  Subjective:  Chief Complaint  Patient presents with   Fracture    Rt hand DOI 11/14/21    History of Present Illness: Jeffrey Gardner is a 35 y.o. male who presents for evaluation of right hand injury.  Approximate 1 week ago, he was walking his dogs, when he fell.  He tried to brace his fall, and injured his hand.  He presented to the emergency department, and radiographs demonstrated a second metacarpal shaft fracture.  He was placed into a thumb spica splint, without immobilization on the second metacarpal.  He was also given some oxycodone.  He continues to have pain.  He is now having some issues with his left thumb as well.  He continues to have swelling.  Medication is helping with the pain.   Review of Systems: No fevers or chills No numbness or tingling No chest pain No shortness of breath No bowel or bladder dysfunction No GI distress No headaches   Medical  History:  History reviewed. No pertinent past medical history.  Past Surgical History:  Procedure Laterality Date   FOOT SURGERY     HAND SURGERY      History reviewed. No pertinent family history. Social History   Tobacco Use   Smoking status: Every Day    Packs/day: 0.50    Types: Cigarettes   Smokeless tobacco: Never  Vaping Use   Vaping Use: Never used  Substance Use Topics   Alcohol use: Yes    Comment: occ   Drug use: No    No Known Allergies  Current Meds  Medication Sig   HYDROcodone-acetaminophen (NORCO/VICODIN) 5-325 MG tablet Take 1 tablet by mouth every 6 (six) hours as needed for moderate pain.   oxyCODONE (ROXICODONE) 5 MG immediate release tablet Take 1 tablet (5 mg total) by mouth every 8 (eight) hours as needed.   Vitamin D, Ergocalciferol, (DRISDOL) 1.25 MG (50000 UNIT) CAPS capsule Take 50,000 Units by mouth once a week.   Current Facility-Administered Medications for the 11/21/21 encounter (Office Visit) with Mordecai Rasmussen, MD  Medication   betamethasone acetate-betamethasone sodium phosphate (CELESTONE) injection 3 mg    Objective: BP (!) 146/92    Pulse 99    Ht 5\' 9"  (1.753 m)    Wt 151 lb (68.5 kg)    BMI 22.30 kg/m   Physical Exam:  General: Alert and oriented. and No acute distress. Gait: Normal gait.  Evaluation the right hand demonstrates diffuse swelling.  No obvious bruising is  appreciated.  Fingers are warm and well-perfused.  There does appear to be a slight deformity over the distal metacarpal shaft.  Tenderness to palpation in this area.  Sensation is intact throughout the hand.   IMAGING: I personally ordered and reviewed the following images  X-ray of the right hand was obtained in clinic today, compared to previous x-rays.  There is a minimally displaced, minimally angulated fracture of the distal right second metacarpal shaft.  No other injuries are noted.  Minimal degenerative changes.  No dislocations.  Impression: Right  second metacarpal shaft fracture in acceptable alignment   New Medications:  Meds ordered this encounter  Medications   HYDROcodone-acetaminophen (NORCO/VICODIN) 5-325 MG tablet    Sig: Take 1 tablet by mouth every 6 (six) hours as needed for moderate pain.    Dispense:  20 tablet    Refill:  0      Mordecai Rasmussen, MD  11/21/2021 10:10 PM

## 2021-11-21 NOTE — Patient Instructions (Addendum)
Please provide a letter - out of work until next visit.   General Cast Instructions  1.  You were placed in a cast in clinic today.  Please keep the cast material clean, dry and intact.  Please do not use anything to itch the under the cast.  If it gets itchy, you can consider taking benadryl, or similar medication.  If the cast material gets wet, place it on a towel and use a hair dryer on a low setting. 2.  Tylenol or Ibuprofen/Naproxen as needed.   3.  Recommend elevating your extremity as much as possible to help with swelling. 4.  F/u 2 weeks, cast off and repeat XR    Smoking Tobacco Information, Adult Smoking tobacco can be harmful to your health. Tobacco contains a poisonous (toxic), colorless chemical called nicotine. Nicotine is addictive. It changes the brain and can make it hard to stop smoking. Tobacco also has other toxic chemicals that can hurt your body and raise your risk of many cancers. How can smoking tobacco affect me? Smoking tobacco puts you at risk for: Cancer. Smoking is most commonly associated with lung cancer, but can also lead to cancer in other parts of the body. Chronic obstructive pulmonary disease (COPD). This is a long-term lung condition that makes it hard to breathe. It also gets worse over time. High blood pressure (hypertension), heart disease, stroke, or heart attack. Lung infections, such as pneumonia. Cataracts. This is when the lenses in the eyes become clouded. Digestive problems. This may include peptic ulcers, heartburn, and gastroesophageal reflux disease (GERD). Oral health problems, such as gum disease and tooth loss. Loss of taste and smell. Smoking can affect your appearance by causing: Wrinkles. Yellow or stained teeth, fingers, and fingernails. Smoking tobacco can also affect your social life, because: It may be challenging to find places to smoke when away from home. Many workplaces, [employer], hotels, and public places are  tobacco-free. Smoking is expensive. This is due to the cost of tobacco and the long-term costs of treating health problems from smoking. Secondhand smoke may affect those around you. Secondhand smoke can cause lung cancer, breathing problems, and heart disease. Children of smokers have a higher risk for: Sudden infant death syndrome (SIDS). Ear infections. Lung infections. If you currently smoke tobacco, quitting now can help you: Lead a longer and healthier life. Look, smell, breathe, and feel better over time. Save money. Protect others from the harms of secondhand smoke. What actions can I take to prevent health problems? Quit smoking  Do not start smoking. Quit if you already do. Make a plan to quit smoking and commit to it. Look for programs to help you and ask your health care provider for recommendations and ideas. Set a date and write down all the reasons you want to quit. Let your friends and family know you are quitting so they can help and support you. Consider finding friends who also want to quit. It can be easier to quit with someone else, so that you can support each other. Talk with your health care provider about using nicotine replacement medicines to help you quit, such as gum, lozenges, patches, sprays, or pills. Do not replace cigarette smoking with electronic cigarettes, which are commonly called e-cigarettes. The safety of e-cigarettes is not known, and some may contain harmful chemicals. If you try to quit but return to smoking, stay positive. It is common to slip up when you first quit, so take it one day at a time.  Be prepared for cravings. When you feel the urge to smoke, chew gum or suck on hard candy. Lifestyle Stay busy and take care of your body. Drink enough fluid to keep your urine pale yellow. Get plenty of exercise and eat a healthy diet. This can help prevent weight gain after quitting. Monitor your eating habits. Quitting smoking can cause you to have a  larger appetite than when you smoke. Find ways to relax. Go out with friends or family to a movie or a restaurant where people do not smoke. Ask your health care provider about having regular tests (screenings) to check for cancer. This may include blood tests, imaging tests, and other tests. Find ways to manage your stress, such as meditation, yoga, or exercise. Where to find support To get support to quit smoking, consider: Asking your health care provider for more information and resources. Taking classes to learn more about quitting smoking. Looking for local organizations that offer resources about quitting smoking. Joining a support group for people who want to quit smoking in your local community. Calling the smokefree.gov counselor helpline: 1-800-Quit-Now 239-880-8331) Where to find more information You may find more information about quitting smoking from: HelpGuide.org: www.helpguide.org https://hall.com/: smokefree.gov American Lung Association: www.lung.org Contact a health care provider if you: Have problems breathing. Notice that your lips, nose, or fingers turn blue. Have chest pain. Are coughing up blood. Feel faint or you pass out. Have other health changes that cause you to worry. Summary Smoking tobacco can negatively affect your health, the health of those around you, your finances, and your social life. Do not start smoking. Quit if you already do. If you need help quitting, ask your health care provider. Think about joining a support group for people who want to quit smoking in your local community. There are many effective programs that will help you to quit this behavior. This information is not intended to replace advice given to you by your health care provider. Make sure you discuss any questions you have with your health care provider. Document Revised: 06/25/2021 Document Reviewed: 09/12/2020 Elsevier Patient Education  2022 [employer].

## 2021-12-05 ENCOUNTER — Encounter: Payer: Self-pay | Admitting: Orthopedic Surgery

## 2021-12-05 ENCOUNTER — Ambulatory Visit (INDEPENDENT_AMBULATORY_CARE_PROVIDER_SITE_OTHER): Payer: Medicaid Other | Admitting: Orthopedic Surgery

## 2021-12-05 ENCOUNTER — Ambulatory Visit: Payer: Medicaid Other

## 2021-12-05 ENCOUNTER — Other Ambulatory Visit: Payer: Self-pay

## 2021-12-05 VITALS — Ht 69.0 in | Wt 151.0 lb

## 2021-12-05 DIAGNOSIS — S62360D Nondisplaced fracture of neck of second metacarpal bone, right hand, subsequent encounter for fracture with routine healing: Secondary | ICD-10-CM

## 2021-12-05 NOTE — Progress Notes (Signed)
Orthopaedic Clinic Return  Assessment: Jeffrey Gardner is a 36 y.o. male with the following: Right 2nd metacarpal neck fracture  Plan: Radiographs stable Swelling and pain improved. Another radial gutter cast placed in clinic today Follow up 2 weeks, repeat XR and begin ROM  Cast application - right short arm cast -radial gutter   Verbal consent was obtained and the correct extremity was identified. A well padded, appropriately molded short arm cast was applied to the right arm Fingers remained warm and well perfused.   There were no sharp edges Patient tolerated the procedure well Cast care instructions were provided    Follow-up: Return in about 2 weeks (around 12/19/2021).   Subjective:  Chief Complaint  Patient presents with   Fracture    Rt hand DOI 11/15/21    History of Present Illness: Jeffrey Gardner is a 36 y.o. male who returns to clinic for repeat evaluation of his right hand.  He sustained a fracture of the 2nd metacarpal approximately 3 weeks ago.  He has tolerated his cast well.  Pain is controlled with Tylenol and ibuprofen.  He notes some stiffness in his wrist and his fingers.  Review of Systems: No fevers or chills No numbness or tingling No chest pain No shortness of breath No bowel or bladder dysfunction No GI distress No headaches   Objective: Ht 5\' 9"  (1.753 m)    Wt 151 lb (68.5 kg)    BMI 22.30 kg/m   Physical Exam:  Right hand with improved swelling.  Mild tenderness to palpation of over the second metacarpal shaft.  Stiffness and pain with range of motion of the left wrist.  Near full range of motion of the small and ring fingers.  Stiffness of the second and third metacarpals.  Fingers warm and well-perfused.  Sensation is intact throughout the right hand.  IMAGING: I personally ordered and reviewed the following images:  X-rays of the right hand were obtained in clinic today.  There has been no interval displacement or angulation at the  fracture site.  There has been interval consolidation.  No acute injuries are noted.  No dislocations.  Impression: Healing right second metacarpal fracture  Mordecai Rasmussen, MD 12/05/2021 11:00 PM

## 2021-12-05 NOTE — Patient Instructions (Signed)

## 2021-12-19 ENCOUNTER — Ambulatory Visit: Payer: Medicaid Other

## 2021-12-19 ENCOUNTER — Ambulatory Visit (INDEPENDENT_AMBULATORY_CARE_PROVIDER_SITE_OTHER): Payer: Medicaid Other | Admitting: Orthopedic Surgery

## 2021-12-19 ENCOUNTER — Other Ambulatory Visit: Payer: Self-pay

## 2021-12-19 ENCOUNTER — Encounter: Payer: Self-pay | Admitting: Orthopedic Surgery

## 2021-12-19 VITALS — Ht 69.0 in | Wt 151.0 lb

## 2021-12-19 DIAGNOSIS — S62360D Nondisplaced fracture of neck of second metacarpal bone, right hand, subsequent encounter for fracture with routine healing: Secondary | ICD-10-CM

## 2021-12-19 NOTE — Progress Notes (Signed)
Orthopaedic Clinic Return  Assessment: Jeffrey Gardner is a 36 y.o. male with the following: Right 2nd metacarpal neck fracture  Plan: Repeat radiographs are stable Minimal pain at the fracture site He has requested a removable wrist brace to support his wrist Start working on range of motion of the wrist, and all fingers. Once he has regained all of his range of motion, we can initiate strengthening.  We will see him back in 2 weeks to evaluate his progression, and discuss return to work.   Follow-up: Return in about 2 weeks (around 01/02/2022).   Subjective:  Chief Complaint  Patient presents with   Fracture    Rt wrist DOI 11/15/21    History of Present Illness: Jeffrey Gardner is a 36 y.o. male who returns to clinic for repeat evaluation of his right hand.  He sustained injury to his right second metacarpal approximately 5 weeks ago.  He has tolerated the cast.  He has no pain.  Upon removal of cast, he notes he has some pain and stiffness in the wrist.  No numbness or tingling.  He is ready get back to work.   Review of Systems: No fevers or chills No numbness or tingling No chest pain No shortness of breath No bowel or bladder dysfunction No GI distress No headaches   Objective: Ht 5\' 9"  (1.753 m)    Wt 151 lb (68.5 kg)    BMI 22.30 kg/m   Physical Exam:  Evaluation of the right hand demonstrates no swelling.  Mild deformity is appreciated at the shaft of the second metacarpal.  Minimal tenderness to palpation in this area.  He demonstrates stiffness of the index and long fingers.  He also has some pain with range of motion of his wrist.  No skin breakdown.  IMAGING: I personally ordered and reviewed the following images:  X-rays of the right hand were obtained in clinic today.  These were compared to prior x-rays.  No acute injuries are noted.  There has been interval consolidation of the fracture to the second metacarpal.  No interval displacement.  Impression: Healing  right second metacarpal fracture  Mordecai Rasmussen, MD 12/19/2021 8:58 AM

## 2021-12-19 NOTE — Patient Instructions (Signed)
Focus on range of motion of your wrist and fingers.  Once you have regained all of your motion, we can advance your strengthening.   Make sure you are coming out of the brace to work on wrist motion too  Follow up in 2 weeks

## 2022-01-02 ENCOUNTER — Encounter: Payer: Self-pay | Admitting: Orthopedic Surgery

## 2022-01-02 ENCOUNTER — Ambulatory Visit (INDEPENDENT_AMBULATORY_CARE_PROVIDER_SITE_OTHER): Payer: Medicaid Other | Admitting: Orthopedic Surgery

## 2022-01-02 ENCOUNTER — Other Ambulatory Visit: Payer: Self-pay

## 2022-01-02 DIAGNOSIS — S62360D Nondisplaced fracture of neck of second metacarpal bone, right hand, subsequent encounter for fracture with routine healing: Secondary | ICD-10-CM

## 2022-01-02 NOTE — Patient Instructions (Signed)
Referral to hand therapy to work on range of motion ? ?Ok to return to work without restrictions 01/07/22; please provide a letter ? ?Follow up in about a month - if you are doing well at that time, please call to cancel the appointment ? ? ?

## 2022-01-02 NOTE — Progress Notes (Signed)
Orthopaedic Clinic Return ? ?Assessment: ?Jeffrey Gardner is a 36 y.o. male with the following: ?Right 2nd metacarpal neck fracture ? ?Plan: ?He has no pain at the fracture site, but has some stiffness in the index finger, with some residual stiffness in the long finger.  As a result, he has some pain.  I recommended hand therapy.  He states he is ready to return to work.  We have provided him with a letter for work.  He can return on 01/07/2022.  We have also placed a referral for occupational therapy.  We will see him in 4 weeks, but if he is doing well at that time, he can call to cancel. ? ? ?Follow-up: ?Return in about 4 weeks (around 01/30/2022). ? ? ?Subjective: ? ?Chief Complaint  ?Patient presents with  ? Hand Problem  ?  Closed fx 2n MC neck, DOI 11/15/21.  He is doing well, no real pain, just stiffness index finger. No brace now.   ? ? ?History of Present Illness: ?Jeffrey Gardner is a 36 y.o. male who returns to clinic for repeat evaluation of his right hand.  He sustained injury to his right second metacarpal approximately 7-8 weeks ago.  He has done well without immobilization.  He reports some stiffness in the index finger.  This is restricting his overall range of motion.  He also has a little bit of pain in the radial wrist. ? ?Review of Systems: ?No fevers or chills ?No numbness or tingling ?No chest pain ?No shortness of breath ?No bowel or bladder dysfunction ?No GI distress ?No headaches ? ? ?Objective: ?There were no vitals taken for this visit. ? ?Physical Exam: ? ?Right hand without swelling.  No tenderness to palpation along the shaft of the second metacarpal.  Residual stiffness in the index finger.  A little bit of stiffness in the long finger.  He is unable to make a full fist as a result.  Mild tenderness to palpation over the radial styloid. ? ? ?IMAGING: ?I personally ordered and reviewed the following images: ? ?No new imaging obtained today. ? ?Mordecai Rasmussen, MD ?01/02/2022 ?8:41 AM ? ? ?

## 2022-01-16 ENCOUNTER — Ambulatory Visit (HOSPITAL_COMMUNITY): Payer: Medicaid Other | Attending: Orthopedic Surgery

## 2022-01-16 ENCOUNTER — Encounter (HOSPITAL_COMMUNITY): Payer: Self-pay

## 2022-01-16 ENCOUNTER — Other Ambulatory Visit: Payer: Self-pay

## 2022-01-16 DIAGNOSIS — M25541 Pain in joints of right hand: Secondary | ICD-10-CM | POA: Diagnosis present

## 2022-01-16 DIAGNOSIS — M25641 Stiffness of right hand, not elsewhere classified: Secondary | ICD-10-CM | POA: Diagnosis present

## 2022-01-16 DIAGNOSIS — R29898 Other symptoms and signs involving the musculoskeletal system: Secondary | ICD-10-CM | POA: Diagnosis not present

## 2022-01-16 NOTE — Patient Instructions (Signed)
AROM: DIP Flexion / Extension ? ? ?Pinch middle knuckle of ________ finger of right hand to prevent bending. Bend end knuckle until stretch is felt. Hold ____ seconds. Relax. Straighten finger as far as possible. ?Repeat ____ times per set. Do ____ sets per session. Do ____ sessions per day. ? ?Copyright ? VHI. All rights reserved.  ? ? ?AROM: PIP Flexion / Extension ? ? ?Pinch bottom knuckle of ________ finger of right hand to prevent bending. Actively bend middle knuckle until stretch is felt. Hold ____ seconds. Relax. Straighten finger as far as possible. ?Repeat ____ times per set. Do ____ sets per session. Do ____ sessions per day. ? ?Copyright ? VHI. All rights reserved.  ? ?AROM: Finger Flexion / Extension ? ? ?Actively bend fingers of right hand. Start with knuckles furthest from palm, and slowly make a fist. Hold ____ seconds. Relax. Then straighten fingers as far as possible. ?Repeat ____ times per set. Do ____ sets per session. Do ____ sessions per day. ? ?Copyright ? VHI. All rights reserved.  ? ?Paper Crumpling Exercise ? ? ?Begin with right palm down on piece of paper. Maintaining contact between surface and heel of hand, crumple paper into a ball. ?Repeat ____ times per set. Do ____ sets per session. Do ____ sessions per day. ? ?Copyright ? VHI. All rights reserved.  ? ? ? ?
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
## 2022-01-22 ENCOUNTER — Encounter (HOSPITAL_COMMUNITY): Payer: Medicaid Other | Admitting: Occupational Therapy

## 2022-01-28 ENCOUNTER — Encounter (HOSPITAL_COMMUNITY): Payer: Medicaid Other | Admitting: Occupational Therapy

## 2022-01-28 ENCOUNTER — Telehealth (HOSPITAL_COMMUNITY): Payer: Self-pay | Admitting: Occupational Therapy

## 2022-01-28 NOTE — Telephone Encounter (Signed)
Attempted to call pt regarding no show for today's OT appt at 3:15. Phone rang continuously, no answer and no voicemail. Unable to leave message.  ? ? ?Guadelupe Sabin, OTR/L  ?[phone] ?01/28/2022 ?

## 2022-01-30 ENCOUNTER — Encounter (HOSPITAL_COMMUNITY): Payer: Self-pay

## 2022-01-30 ENCOUNTER — Ambulatory Visit (HOSPITAL_COMMUNITY): Payer: Medicaid Other

## 2022-01-30 ENCOUNTER — Encounter: Payer: Self-pay | Admitting: Orthopedic Surgery

## 2022-01-30 ENCOUNTER — Telehealth (HOSPITAL_COMMUNITY): Payer: Self-pay

## 2022-01-30 ENCOUNTER — Encounter: Payer: Medicaid Other | Admitting: Orthopedic Surgery

## 2022-01-30 NOTE — Telephone Encounter (Signed)
Called patient regarding his no show for OT appointment today on 01/30/22. Number on file just rang and rang. No option to leave a message or speak to anyone. Patient has not returned to clinic since initial evaluation and will be discharged. ? ?Ailene Ravel, OTR/L,CBIS  ?[phone] ? ?

## 2022-01-30 NOTE — Therapy (Signed)
[facility] ?[address] ?[place], [place], [address] ?Phone: [phone]   Fax:  [phone] ? ?Patient Details  ?Name: Jeffrey Gardner ?MRN: [id number] ?Date of Birth: November 15, 1985 ?Referring Provider: Arther Abbott, MD ? ?Encounter Date: 01/30/2022 ? ? ?OCCUPATIONAL THERAPY DISCHARGE SUMMARY ? ?Visits from Start of Care: 1 ? ?Current functional level related to goals / functional outcomes: ?All goals remain current since evaluation as patient has not returned to clinic. No showed to all scheduled appointments. Unable to contact patient.  ? ?  ?SHORT TERM GOALS: Target date: 01/30/2022 ?  ?Patient will be educated and independent with HEP in order to facilitate progress in therapy and allow him to return to using his right hand without ROM and strength deficits.  ?  ? ?  ?2.  Patient will increase his right hand grip strength by 25# and his pinch strength by 5# where possible in order to complete work tasks requiring sustained pinch and grip with less difficulty and pain.  ?  ? ?  ?3.  Pt will increase his right index and middle finger A/ROM where needed to full range or close to full range in order to form a full tight fist. ?  ? ?  ?4.  Patient will decrease fascial restrictions to minimal amount in order to increase joint mobility needed in his hand to complete all necessary daily and work related tasks with less difficulty.  ?  ? ?  ?Remaining deficits: ?All deficits remain. ?  ?Education / Equipment: ?Verbal HEP provided during evaluation. hand strengthening - red putty  ? ?Patient agrees to discharge. Patient goals were not met. Patient is being discharged due to not returning since the last visit.. ? ? ? ? ?Ailene Ravel, OTR/L,CBIS  ?[phone] ? ?01/30/2022, 3:54 PM ? ?[facility] ?[address] ?[place], [place], [address] ?Phone: [phone]   Fax:  [phone] ?

## 2022-03-18 ENCOUNTER — Emergency Department (HOSPITAL_COMMUNITY)
Admission: EM | Admit: 2022-03-18 | Discharge: 2022-03-18 | Disposition: A | Discharge status: Home / Self Care | Payer: Medicaid Other | Attending: Emergency Medicine | Admitting: Emergency Medicine

## 2022-03-18 ENCOUNTER — Other Ambulatory Visit: Payer: Self-pay

## 2022-03-18 ENCOUNTER — Encounter (HOSPITAL_COMMUNITY): Payer: Self-pay

## 2022-03-18 ENCOUNTER — Emergency Department (HOSPITAL_COMMUNITY): Payer: Medicaid Other

## 2022-03-18 DIAGNOSIS — Y9367 Activity, basketball: Secondary | ICD-10-CM | POA: Diagnosis not present

## 2022-03-18 DIAGNOSIS — W228XXA Striking against or struck by other objects, initial encounter: Secondary | ICD-10-CM | POA: Diagnosis not present

## 2022-03-18 DIAGNOSIS — S20211A Contusion of right front wall of thorax, initial encounter: Secondary | ICD-10-CM | POA: Diagnosis not present

## 2022-03-18 DIAGNOSIS — S29001A Unspecified injury of muscle and tendon of front wall of thorax, initial encounter: Secondary | ICD-10-CM | POA: Diagnosis present

## 2022-03-18 MED ORDER — IBUPROFEN 800 MG PO TABS: 800.0000 mg | ORAL_TABLET | Freq: Three times a day (TID) | ORAL | 0 refills | Status: DC | PRN
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
## 2022-03-18 NOTE — Discharge Instructions (Signed)
Follow-up with your doctor next week if not improving °
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
## 2022-07-24 ENCOUNTER — Emergency Department (HOSPITAL_COMMUNITY)
Admission: EM | Admit: 2022-07-24 | Discharge: 2022-07-24 | Disposition: A | Discharge status: Home / Self Care | Payer: Medicaid Other | Attending: Emergency Medicine | Admitting: Emergency Medicine

## 2022-07-24 ENCOUNTER — Emergency Department (HOSPITAL_COMMUNITY): Payer: Medicaid Other

## 2022-07-24 ENCOUNTER — Other Ambulatory Visit: Payer: Self-pay

## 2022-07-24 ENCOUNTER — Encounter (HOSPITAL_COMMUNITY): Payer: Self-pay | Admitting: *Deleted

## 2022-07-24 DIAGNOSIS — S9031XA Contusion of right foot, initial encounter: Secondary | ICD-10-CM | POA: Diagnosis not present

## 2022-07-24 DIAGNOSIS — Y9367 Activity, basketball: Secondary | ICD-10-CM | POA: Insufficient documentation

## 2022-07-24 DIAGNOSIS — W52XXXA Crushed, pushed or stepped on by crowd or human stampede, initial encounter: Secondary | ICD-10-CM | POA: Diagnosis not present

## 2022-07-24 DIAGNOSIS — S99921A Unspecified injury of right foot, initial encounter: Secondary | ICD-10-CM | POA: Diagnosis present

## 2022-07-24 MED ORDER — IBUPROFEN 800 MG PO TABS: 800.0000 mg | ORAL_TABLET | Freq: Three times a day (TID) | ORAL | 0 refills | Status: AC

## 2022-07-24 MED ORDER — IBUPROFEN 800 MG PO TABS
800.0000 mg | ORAL_TABLET | Freq: Once | ORAL | Status: AC
  Administered 2022-07-24: 800 mg via ORAL
  Filled 2022-07-24: qty 1
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
## 2022-07-24 NOTE — ED Notes (Signed)
Post-operative shoe applied to R foot and crutches given to pt--pt verbalizes understanding of how to use crutches

## 2022-07-24 NOTE — Discharge Instructions (Signed)
Elevate your foot when possible.  Apply ice packs on and off.  Use the crutches and postop shoe for weightbearing.  Follow-up with the orthopedic provider listed in 1 week if your symptoms are not improving.

## 2023-04-29 ENCOUNTER — Encounter (HOSPITAL_COMMUNITY): Payer: Self-pay

## 2023-04-29 ENCOUNTER — Other Ambulatory Visit: Payer: Self-pay

## 2023-04-29 ENCOUNTER — Emergency Department (HOSPITAL_COMMUNITY)
Admission: EM | Admit: 2023-04-29 | Discharge: 2023-04-29 | Disposition: A | Discharge status: Home / Self Care | Payer: Self-pay | Attending: Emergency Medicine | Admitting: Emergency Medicine

## 2023-04-29 DIAGNOSIS — L03211 Cellulitis of face: Secondary | ICD-10-CM | POA: Insufficient documentation

## 2023-04-29 MED ORDER — DOXYCYCLINE HYCLATE 100 MG PO CAPS: 100.0000 mg | ORAL_CAPSULE | Freq: Two times a day (BID) | ORAL | 0 refills | Status: AC

## 2023-04-29 NOTE — Discharge Instructions (Addendum)
You have been prescribed an antibiotic today, doxycycline.  Please take the full course of this antibiotic even if you start feeling better.  Please try to avoid being in the sun while on this medication as it can make your skin more sensitive. Antibiotics may also cause diarrhea.  Please apply hot compresses to your forehead.  Hot showers can also be beneficial, you may let the hot water run on your forehead to help your infection drain.   Please return to the ER if your swelling does not improve on the antibiotic, you develop difficulty breathing, shortness of breath, your swelling spreads.

## 2023-04-29 NOTE — ED Provider Notes (Signed)
[facility] EMERGENCY DEPARTMENT AT [facility] Provider Note   CSN: [id number] Arrival date & time: 04/29/23  4098     History  Chief Complaint  Patient presents with   Facial Swelling    Jeffrey Gardner is a 37 y.o. male presents with concern for facial swelling.  He states that yesterday night he noticed some throbbing in his head and slight swelling of his forehead.  He took Tylenol but when he woke up this morning his face was significantly more swollen and his left eye was shut due to eyelid swelling.  He denies taking any new medications, any insect bites.  Denies fever, chills, tongue or lip swelling, shortness of breath, difficulty breathing.  HPI     Home Medications Prior to Admission medications   Medication Sig Start Date End Date Taking? Authorizing Provider  doxycycline (VIBRAMYCIN) 100 MG capsule Take 1 capsule (100 mg total) by mouth 2 (two) times daily for 10 days. 04/29/23 05/09/23 Yes Arabella Merles, PA-C  HYDROcodone-acetaminophen (NORCO/VICODIN) 5-325 MG tablet Take 1 tablet by mouth every 6 (six) hours as needed for moderate pain. Patient not taking: Reported on 01/02/2022 11/21/21   Oliver Barre, MD  ibuprofen (ADVIL) 800 MG tablet Take 1 tablet (800 mg total) by mouth 3 (three) times daily. Take with food 07/24/22   Triplett, Tammy, PA-C  methocarbamol (ROBAXIN) 500 MG tablet Take 1 tablet (500 mg total) by mouth 2 (two) times daily. Patient not taking: Reported on 01/02/2022 10/23/21   Derwood Kaplan, MD  Vitamin D, Ergocalciferol, (DRISDOL) 1.25 MG (50000 UNIT) CAPS capsule Take 50,000 Units by mouth once a week. Patient not taking: Reported on 07/24/2022 08/31/21   [provider]      Allergies    Patient has no known allergies.    Review of Systems   Review of Systems  Constitutional:  Negative for chills and fever.  Respiratory:  Negative for shortness of breath.     Physical Exam Updated Vital Signs BP (!) 132/91 (BP Location:  Right Arm)   Pulse 95   Temp 98 F (36.7 C) (Oral)   Resp 20   Ht 5\' 9"  (1.753 m)   Wt 70.8 kg   SpO2 96%   BMI 23.04 kg/m  Physical Exam Vitals and nursing note reviewed.  Constitutional:      Appearance: Normal appearance.  HENT:     Head: Atraumatic.  Cardiovascular:     Rate and Rhythm: Normal rate and regular rhythm.  Pulmonary:     Effort: Pulmonary effort is normal.     Breath sounds: Normal breath sounds.  Skin:    Comments: Medial forehead edema and left eyelid edema Forehead with circular area of serous drainage and slight surrounding erythema  No lip or tongue swelling  Neurological:     General: No focal deficit present.     Mental Status: He is alert.  Psychiatric:        Mood and Affect: Mood normal.        Behavior: Behavior normal.     ED Results / Procedures / Treatments   Labs (all labs ordered are listed, but only abnormal results are displayed) Labs Reviewed - No data to display  EKG None  Radiology No results found.  Procedures Procedures    Medications Ordered in ED Medications - No data to display  ED Course/ Medical Decision Making/ A&P  Medical Decision Making  37 y.o. male presents to the ED for concern of facial swelling  Differential diagnosis includes but is not limited to angioedema, insect bite, cellulitis, allergic reaction, adverse medication effect.  ED Course:  Patient with stable vitals and in no distress.  Facial swelling is consistent with a cellulitis given area of skin breakage noted.  He is not taking any new medications, do not suspect this is a allergic reaction or adverse medication effect.  Is not on any ACE inhibitors, no swelling of the tongue or lips, angioedema less likely.  Impression: Cellulitis of the forehead  Disposition:  The patient was discharged home with instructions to take 10 day course of doxycyline and use warm compresses. Discussed he may need to return if  not improving for incision and drainage. Return precautions given.   Lab Tests: None  Imaging Studies ordered: None indicated   Cardiac Monitoring: / EKG: None indicated   Consultations Obtained: None indicated   Co morbidities that complicate the patient evaluation  None  Social Determinants of Health:  unknown              Final Clinical Impression(s) / ED Diagnoses Final diagnoses:  Facial cellulitis    Rx / DC Orders ED Discharge Orders          Ordered    doxycycline (VIBRAMYCIN) 100 MG capsule  2 times daily        04/29/23 0730              Arabella Merles, PA-C 04/29/23 0454    Eber Hong, MD 05/09/23 0700

## 2023-04-29 NOTE — ED Triage Notes (Signed)
Pt had headache yesterday, took 2 tylenol (taken tylenol before), woke up this am with swelling. States it was swollen and somewhat painful last night but woke up this am in more pain.

## 2023-11-30 ENCOUNTER — Other Ambulatory Visit: Payer: Self-pay

## 2023-11-30 ENCOUNTER — Encounter (HOSPITAL_COMMUNITY): Payer: Self-pay

## 2023-11-30 ENCOUNTER — Emergency Department (HOSPITAL_COMMUNITY)
Admission: EM | Admit: 2023-11-30 | Discharge: 2023-11-30 | Disposition: A | Discharge status: Home / Self Care | Payer: Self-pay

## 2023-11-30 DIAGNOSIS — S0101XA Laceration without foreign body of scalp, initial encounter: Secondary | ICD-10-CM | POA: Insufficient documentation
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
## 2023-11-30 NOTE — Discharge Instructions (Addendum)
Please return to the emergency department in 1 week for staple removal.  Return develop any fevers, chills, wound develops odor, drains pus, or you develop any new or worsening symptoms that are concerning to you.
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
## 2023-12-07 ENCOUNTER — Other Ambulatory Visit: Payer: Self-pay

## 2023-12-07 ENCOUNTER — Emergency Department (HOSPITAL_COMMUNITY)
Admission: EM | Admit: 2023-12-07 | Discharge: 2023-12-07 | Disposition: A | Discharge status: Home / Self Care | Payer: Self-pay

## 2023-12-07 ENCOUNTER — Encounter (HOSPITAL_COMMUNITY): Payer: Self-pay | Admitting: *Deleted

## 2023-12-07 DIAGNOSIS — Z4802 Encounter for removal of sutures: Secondary | ICD-10-CM | POA: Insufficient documentation

## 2023-12-07 NOTE — ED Triage Notes (Signed)
Pt here for staple removal, denies any problems with site.
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
## 2023-12-07 NOTE — Discharge Instructions (Signed)
You were seen in the emergency department for suture/staple removal.  Your wound appears to be healing well. Please keep it clean and appropriately dressed.   

## 2023-12-15 ENCOUNTER — Emergency Department (HOSPITAL_COMMUNITY)
Admission: EM | Admit: 2023-12-15 | Discharge: 2023-12-15 | Disposition: A | Discharge status: Home / Self Care | Payer: Self-pay | Attending: Emergency Medicine | Admitting: Emergency Medicine

## 2023-12-15 ENCOUNTER — Encounter (HOSPITAL_COMMUNITY): Payer: Self-pay

## 2023-12-15 ENCOUNTER — Emergency Department (HOSPITAL_COMMUNITY): Payer: Self-pay

## 2023-12-15 ENCOUNTER — Other Ambulatory Visit: Payer: Self-pay

## 2023-12-15 DIAGNOSIS — J101 Influenza due to other identified influenza virus with other respiratory manifestations: Secondary | ICD-10-CM | POA: Insufficient documentation

## 2023-12-15 DIAGNOSIS — Z20822 Contact with and (suspected) exposure to covid-19: Secondary | ICD-10-CM | POA: Insufficient documentation

## 2023-12-15 LAB — RESP PANEL BY RT-PCR (RSV, FLU A&B, COVID)  RVPGX2
Influenza A by PCR: POSITIVE — AB
Influenza B by PCR: NEGATIVE
Resp Syncytial Virus by PCR: NEGATIVE
SARS Coronavirus 2 by RT PCR: NEGATIVE

## 2023-12-15 MED ORDER — OSELTAMIVIR PHOSPHATE 75 MG PO CAPS
75.0000 mg | ORAL_CAPSULE | Freq: Two times a day (BID) | ORAL | 0 refills | Status: AC
Start: 2023-12-15 — End: ?

## 2023-12-15 NOTE — ED Provider Notes (Signed)
[place] EMERGENCY DEPARTMENT AT [facility] Provider Note   CSN: [id number] Arrival date & time: 12/15/23  1211     History Chief Complaint  Patient presents with   Influenza    Jeffrey Gardner is a 38 y.o. male.  Patient with smoking history presents emergency department concerns of flulike symptoms.  Endorsing body aches, chills, fevers, night sweats.  States that he has been around his son who is recently positive for influenza A.  He denies any chest pain shortness of breath.  Has not been take any medications up to this point to manage any symptoms.   Influenza      Home Medications Prior to Admission medications   Medication Sig Start Date End Date Taking? Authorizing Provider  oseltamivir (TAMIFLU) 75 MG capsule Take 1 capsule (75 mg total) by mouth every 12 (twelve) hours. 12/15/23  Yes Smitty Knudsen, PA-C  HYDROcodone-acetaminophen (NORCO/VICODIN) 5-325 MG tablet Take 1 tablet by mouth every 6 (six) hours as needed for moderate pain. Patient not taking: Reported on 01/02/2022 11/21/21   Oliver Barre, MD  ibuprofen (ADVIL) 800 MG tablet Take 1 tablet (800 mg total) by mouth 3 (three) times daily. Take with food 07/24/22   Triplett, Tammy, PA-C  methocarbamol (ROBAXIN) 500 MG tablet Take 1 tablet (500 mg total) by mouth 2 (two) times daily. Patient not taking: Reported on 01/02/2022 10/23/21   Derwood Kaplan, MD  Vitamin D, Ergocalciferol, (DRISDOL) 1.25 MG (50000 UNIT) CAPS capsule Take 50,000 Units by mouth once a week. Patient not taking: Reported on 07/24/2022 08/31/21   [provider]      Allergies    Patient has no known allergies.    Review of Systems   Review of Systems  Constitutional:  Positive for activity change.  All other systems reviewed and are negative.   Physical Exam Updated Vital Signs BP (!) 155/99   Pulse 96   Temp 98.6 F (37 C) (Oral)   Resp 15   Ht 5\' 9"  (1.753 m)   Wt 65.8 kg   SpO2 100%   BMI 21.42 kg/m   Physical Exam Vitals and nursing note reviewed.  Constitutional:      General: He is not in acute distress.    Appearance: He is well-developed.  HENT:     Head: Normocephalic and atraumatic.  Eyes:     Conjunctiva/sclera: Conjunctivae normal.  Cardiovascular:     Rate and Rhythm: Normal rate and regular rhythm.     Heart sounds: No murmur heard. Pulmonary:     Effort: Pulmonary effort is normal. No respiratory distress.     Breath sounds: Normal breath sounds.  Abdominal:     Palpations: Abdomen is soft.     Tenderness: There is no abdominal tenderness.  Musculoskeletal:        General: No swelling.     Cervical back: Neck supple.  Skin:    General: Skin is warm and dry.     Capillary Refill: Capillary refill takes less than 2 seconds.  Neurological:     Mental Status: He is alert.  Psychiatric:        Mood and Affect: Mood normal.     ED Results / Procedures / Treatments   Labs (all labs ordered are listed, but only abnormal results are displayed) Labs Reviewed  RESP PANEL BY RT-PCR (RSV, FLU A&B, COVID)  RVPGX2 - Abnormal; Notable for the following components:      Result Value   Influenza  A by PCR POSITIVE (*)    All other components within normal limits    EKG None  Radiology DG Chest 2 View Result Date: 12/15/2023 CLINICAL DATA:  Shortness of breath.  Chest pain. EXAM: CHEST - 2 VIEW COMPARISON:  Chest radiograph dated Mar 18, 2022. FINDINGS: The heart size and mediastinal contours are within normal limits. Evidence of chronic bullous emphysema is again noted in the right lung. No focal consolidation, pleural effusion, or pneumothorax. Remote healed left posterior seventh rib fracture. No acute osseous abnormality. IMPRESSION: 1. No acute cardiopulmonary findings. 2. Chronic right lung bullous emphysema. Electronically Signed   By: Hart Robinsons M.D.   On: 12/15/2023 15:42    Procedures Procedures    Medications Ordered in ED Medications - No data to  display  ED Course/ Medical Decision Making/ A&P                                 Medical Decision Making Amount and/or Complexity of Data Reviewed Radiology: ordered.  Risk Prescription drug management.   This patient presents to the ED for concern of influenza. Differential diagnosis includes COVID-19, influenza, pneumonia, bronchitis   Lab Tests:  I Ordered, and personally interpreted labs.  The pertinent results include: Respiratory panel positive for influenza A   Imaging Studies ordered:  I ordered imaging studies including chest x-ray I independently visualized and interpreted imaging which showed No acute cardiopulmonary findings. 2. Chronic right lung bullous emphysema. I agree with the radiologist interpretation  Problem List / ED Course:  Patient without significant medical history presents emergency department with concerns of influenza.  States that he has had flulike symptoms including night sweats, body aches, cough, and congestion for the last day or so.  Patient reports that his son was recently positive for influenza as well. On exam, there is no appreciable new heart murmurs or abnormal lung sounds.  Patient appears to be well-hydrated with a cap refill less than 2 seconds.  Doubt any acute complications from influenza at this time.  Chest x-ray is still pending however on my personal rotation, no acute findings such as pneumonia or pleural effusion are seen. Given reassuring exam, I feel the patient is stable for outpatient follow-up.  Discharged home with a prescription for Tamiflu per his request.  Advised close follow-up with PCP.  Final Clinical Impression(s) / ED Diagnoses Final diagnoses:  Influenza A    Rx / DC Orders ED Discharge Orders          Ordered    oseltamivir (TAMIFLU) 75 MG capsule  Every 12 hours        12/15/23 1429              Smitty Knudsen, PA-C 12/15/23 1707    Derwood Kaplan, MD 12/16/23 [phone]

## 2023-12-15 NOTE — ED Triage Notes (Signed)
 Pt arrived via POV c/o body aches, chills, and reports sweating through his shirts overnight.

## 2023-12-15 NOTE — Discharge Instructions (Signed)
 You were seen in the emergency department today for concerns of flulike illness.  You tested positive for influenza A.  This is likely due to exposure to your son who is sick with similar influenza.  I would recommend trying to maintain her symptoms at home with aggressive hydration, Tylenol and ibuprofen as needed for fever or bodyaches.  A prescription for Tamiflu sent to your pharmacy.  Please follow-up with your PCP.  For any new or worsening symptoms such as notable chest pain, shortness of breath, or severe dehydration, return to the emergency department.

## 2024-01-15 ENCOUNTER — Emergency Department (HOSPITAL_COMMUNITY): Payer: Self-pay

## 2024-01-15 ENCOUNTER — Emergency Department (HOSPITAL_COMMUNITY)
Admission: EM | Admit: 2024-01-15 | Discharge: 2024-01-15 | Disposition: A | Discharge status: Home / Self Care | Payer: Self-pay

## 2024-01-15 ENCOUNTER — Encounter (HOSPITAL_COMMUNITY): Payer: Self-pay | Admitting: Emergency Medicine

## 2024-01-15 ENCOUNTER — Other Ambulatory Visit: Payer: Self-pay

## 2024-01-15 DIAGNOSIS — M79602 Pain in left arm: Secondary | ICD-10-CM | POA: Insufficient documentation

## 2024-01-15 DIAGNOSIS — M25512 Pain in left shoulder: Secondary | ICD-10-CM | POA: Insufficient documentation

## 2024-01-15 DIAGNOSIS — R202 Paresthesia of skin: Secondary | ICD-10-CM | POA: Insufficient documentation

## 2024-01-15 MED ORDER — OXYCODONE-ACETAMINOPHEN 5-325 MG PO TABS
1.0000 | ORAL_TABLET | Freq: Once | ORAL | Status: AC
  Administered 2024-01-15: 1 via ORAL
  Filled 2024-01-15: qty 1

## 2024-01-15 MED ORDER — METHOCARBAMOL 500 MG PO TABS: 500.0000 mg | ORAL_TABLET | Freq: Three times a day (TID) | ORAL | 0 refills | Status: AC

## 2024-01-15 MED ORDER — OXYCODONE-ACETAMINOPHEN 5-325 MG PO TABS: 1.0000 | ORAL_TABLET | ORAL | 0 refills | Status: AC | PRN

## 2024-01-15 MED ORDER — METHOCARBAMOL 500 MG PO TABS
500.0000 mg | ORAL_TABLET | Freq: Once | ORAL | Status: AC
  Administered 2024-01-15: 500 mg via ORAL
  Filled 2024-01-15: qty 1

## 2024-01-15 MED ORDER — PREDNISONE 10 MG PO TABS: ORAL_TABLET | ORAL | 0 refills | Status: AC

## 2024-01-15 NOTE — ED Triage Notes (Signed)
 Pt has had intermittent left shoulder pain x 2 months.  When he is working sometimes pain worsens with lifting.  Feels a "sharp pain" that radiates down to his hand.  No specific known injury.

## 2024-01-15 NOTE — Discharge Instructions (Signed)
 You may try alternating ice and heat to your shoulder.  Take the medication as directed.  Please call the orthopedic provider listed to arrange follow-up appointment in 1 week if your symptoms are not improving.  Return to the emergency department if you develop any new or worsening symptoms.

## 2024-01-15 NOTE — ED Notes (Signed)
 Patient discharged. Provider spoke to patient. Paperwork given to patient and reviewed. Pt verbalized understanding. VSS. A+Ox4. Patient ambulated out of the ER with steady, independent gait. No iv in place.

## 2024-01-16 NOTE — ED Provider Notes (Signed)
 [place] EMERGENCY DEPARTMENT AT [facility] Provider Note   CSN: [id number] Arrival date & time: 01/15/24  1539     History  Chief Complaint  Patient presents with   Shoulder Pain    Jeffrey Gardner is a 38 y.o. male.   Shoulder Pain Associated symptoms: no fever and no neck pain         Jeffrey Gardner is a 38 y.o. male who presents to the Emergency Department complaining of left shoulder pain.  Symptoms present for 2 months.  He endorses heavy lifting at his job, denies known injury.  He describes having pain radiating into his left arm and hand.  Having pins and needles sensation intermittently in his fingers.     Home Medications Prior to Admission medications   Medication Sig Start Date End Date Taking? Authorizing Provider  methocarbamol (ROBAXIN) 500 MG tablet Take 1 tablet (500 mg total) by mouth 3 (three) times daily. 01/15/24  Yes Kynsleigh Westendorf, PA-C  oxyCODONE-acetaminophen (PERCOCET/ROXICET) 5-325 MG tablet Take 1 tablet by mouth every 4 (four) hours as needed. 01/15/24  Yes Maimuna Leaman, PA-C  predniSONE (DELTASONE) 10 MG tablet Take 6 tablets day one, 5 tablets day two, 4 tablets day three, 3 tablets day four, 2 tablets day five, then 1 tablet day six 01/15/24  Yes Jehan Bonano, PA-C  ibuprofen (ADVIL) 800 MG tablet Take 1 tablet (800 mg total) by mouth 3 (three) times daily. Take with food 07/24/22   Charlesetta Milliron, PA-C  oseltamivir (TAMIFLU) 75 MG capsule Take 1 capsule (75 mg total) by mouth every 12 (twelve) hours. 12/15/23   Smitty Knudsen, PA-C  Vitamin D, Ergocalciferol, (DRISDOL) 1.25 MG (50000 UNIT) CAPS capsule Take 50,000 Units by mouth once a week. Patient not taking: Reported on 07/24/2022 08/31/21   [provider]      Allergies    Patient has no known allergies.    Review of Systems   Review of Systems  Constitutional:  Negative for chills and fever.  Respiratory:  Negative for chest tightness and shortness of breath.    Cardiovascular:  Negative for chest pain.  Gastrointestinal:  Negative for abdominal pain, diarrhea, nausea and vomiting.  Musculoskeletal:  Positive for arthralgias (left shoulder pain). Negative for neck pain.  Skin:  Negative for pallor and wound.  Neurological:  Negative for weakness, numbness and headaches.    Physical Exam Updated Vital Signs BP (!) 154/85   Pulse 85   Temp 98.9 F (37.2 C) (Oral)   Resp 16   SpO2 100%  Physical Exam Vitals and nursing note reviewed.  Constitutional:      General: He is not in acute distress.    Appearance: Normal appearance. He is not toxic-appearing.  HENT:     Head: Atraumatic.  Cardiovascular:     Rate and Rhythm: Normal rate and regular rhythm.     Pulses: Normal pulses.  Pulmonary:     Effort: No respiratory distress.  Chest:     Chest wall: No tenderness.  Musculoskeletal:        General: Normal range of motion.     Left shoulder: Tenderness present. No swelling, bony tenderness or crepitus. Normal range of motion. Normal strength. Normal pulse.     Cervical back: Normal range of motion. No tenderness.     Comments: Pain with abduction of left arm.  No crepitus or edema of the shoulder joint.  No erythema or excessive warmth.   No bony deformity  Lymphadenopathy:  Cervical: No cervical adenopathy.  Skin:    General: Skin is warm.     Capillary Refill: Capillary refill takes less than 2 seconds.     Findings: No erythema or rash.  Neurological:     General: No focal deficit present.     Sensory: No sensory deficit.     Motor: No weakness.     ED Results / Procedures / Treatments   Labs (all labs ordered are listed, but only abnormal results are displayed) Labs Reviewed - No data to display  EKG None  Radiology DG Shoulder Left Result Date: 01/15/2024 CLINICAL DATA:  Intermittent shoulder pain EXAM: LEFT SHOULDER - 2+ VIEW COMPARISON:  None Available. FINDINGS: There is no evidence of fracture or dislocation.  There is no evidence of arthropathy or other focal bone abnormality. Soft tissues are unremarkable. IMPRESSION: Negative. Electronically Signed   By: Jasmine Pang M.D.   On: 01/15/2024 18:51    Procedures Procedures    Medications Ordered in ED Medications  oxyCODONE-acetaminophen (PERCOCET/ROXICET) 5-325 MG per tablet 1 tablet (1 tablet Oral Given 01/15/24 1907)  methocarbamol (ROBAXIN) tablet 500 mg (500 mg Oral Given 01/15/24 1907)    ED Course/ Medical Decision Making/ A&P                                 Medical Decision Making Pt with shoulder pain x 2 months. No new symptoms.  He is also having paraesthesias of left hand.  Left hand dominant.    Possible inflammatory process.  No history of trauma to suggest fx or dislocation.  No neck pain to suggest radiculopathy.  No clinical findings suggestive of septic joint.     Amount and/or Complexity of Data Reviewed Radiology: ordered.    Details: XR w/o fx or dislocation Discussion of management or test interpretation with external provider(s): Doubt emergent process.  Pt agreeable to symptomatic treatment and close out pt f/u with orthopedics.    Risk Prescription drug management.           Final Clinical Impression(s) / ED Diagnoses Final diagnoses:  Acute pain of left shoulder    Rx / DC Orders ED Discharge Orders          Ordered    oxyCODONE-acetaminophen (PERCOCET/ROXICET) 5-325 MG tablet  Every 4 hours PRN        01/15/24 1906    methocarbamol (ROBAXIN) 500 MG tablet  3 times daily        01/15/24 1906    predniSONE (DELTASONE) 10 MG tablet        01/15/24 1906              Pauline Aus, PA-C 01/16/24 2332    Durwin Glaze, MD 01/19/24 [id number]

## 2024-04-02 ENCOUNTER — Encounter (HOSPITAL_COMMUNITY): Payer: Self-pay | Admitting: Emergency Medicine

## 2024-04-02 ENCOUNTER — Emergency Department (HOSPITAL_COMMUNITY)
Admission: EM | Admit: 2024-04-02 | Discharge: 2024-04-02 | Disposition: A | Discharge status: Home / Self Care | Payer: Self-pay | Attending: Emergency Medicine | Admitting: Emergency Medicine

## 2024-04-02 ENCOUNTER — Other Ambulatory Visit: Payer: Self-pay

## 2024-04-02 ENCOUNTER — Emergency Department (HOSPITAL_COMMUNITY): Payer: Self-pay

## 2024-04-02 DIAGNOSIS — L0201 Cutaneous abscess of face: Secondary | ICD-10-CM | POA: Insufficient documentation

## 2024-04-02 LAB — CBC WITH DIFFERENTIAL/PLATELET
Abs Immature Granulocytes: 0.01 10*3/uL (ref 0.00–0.07)
Basophils Absolute: 0 10*3/uL (ref 0.0–0.1)
Basophils Relative: 1 %
Eosinophils Absolute: 0.1 10*3/uL (ref 0.0–0.5)
Eosinophils Relative: 2 %
HCT: 44.6 % (ref 39.0–52.0)
Hemoglobin: 15.3 g/dL (ref 13.0–17.0)
Immature Granulocytes: 0 %
Lymphocytes Relative: 36 %
Lymphs Abs: 2 10*3/uL (ref 0.7–4.0)
MCH: 33 pg (ref 26.0–34.0)
MCHC: 34.3 g/dL (ref 30.0–36.0)
MCV: 96.1 fL (ref 80.0–100.0)
Monocytes Absolute: 0.4 10*3/uL (ref 0.1–1.0)
Monocytes Relative: 7 %
Neutro Abs: 3.1 10*3/uL (ref 1.7–7.7)
Neutrophils Relative %: 54 %
Platelets: 199 10*3/uL (ref 150–400)
RBC: 4.64 MIL/uL (ref 4.22–5.81)
RDW: 13.7 % (ref 11.5–15.5)
WBC: 5.6 10*3/uL (ref 4.0–10.5)
nRBC: 0 % (ref 0.0–0.2)

## 2024-04-02 LAB — BASIC METABOLIC PANEL WITH GFR
Anion gap: 7 (ref 5–15)
BUN: 16 mg/dL (ref 6–20)
CO2: 26 mmol/L (ref 22–32)
Calcium: 9.1 mg/dL (ref 8.9–10.3)
Chloride: 106 mmol/L (ref 98–111)
Creatinine, Ser: 0.91 mg/dL (ref 0.61–1.24)
GFR, Estimated: >60 mL/min (ref >60)
Glucose, Bld: 97 mg/dL (ref 70–99)
Potassium: 4.6 mmol/L (ref 3.5–5.1)
Sodium: 139 mmol/L (ref 135–145)

## 2024-04-02 MED ORDER — LIDOCAINE-EPINEPHRINE (PF) 2 %-1:200000 IJ SOLN
10.0000 mL | Freq: Once | INTRAMUSCULAR | Status: AC
  Administered 2024-04-02: 10 mL
  Filled 2024-04-02: qty 20

## 2024-04-02 MED ORDER — FENTANYL CITRATE PF 50 MCG/ML IJ SOSY
50.0000 ug | PREFILLED_SYRINGE | Freq: Once | INTRAMUSCULAR | Status: AC
  Administered 2024-04-02: 50 ug via INTRAVENOUS
  Filled 2024-04-02: qty 1

## 2024-04-02 MED ORDER — IOHEXOL 300 MG/ML  SOLN
75.0000 mL | Freq: Once | INTRAMUSCULAR | Status: AC | PRN
  Administered 2024-04-02: 75 mL via INTRAVENOUS

## 2024-04-02 NOTE — ED Notes (Signed)
 EDP in to lance abscess area.

## 2024-04-02 NOTE — ED Notes (Signed)
 See triage notes. Nad. C/o headache as well.

## 2024-04-02 NOTE — ED Triage Notes (Signed)
 Pt c/o abscess on middle of forehead that started Sunday. States this is a reoccurring abscess that has repeatedly formed four times already in the same spot, even after Tx. States "my eyes swoll shut", eyes are currently open at this time. However, noticeable swelling present.  rates pain 10/10 forehead.

## 2024-04-02 NOTE — ED Provider Notes (Signed)
 Jeffrey Gardner Provider Note   CSN: [id number] Arrival date & time: 04/02/24  0757     History  Chief Complaint  Patient presents with   Abscess    Jeffrey Gardner is a 38 y.o. male.  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  He suspects the abscess has been there for about 3 days.  He reports subjective fevers and chills.  He is also having a significant headache with it and feels like there is pressure behind the abscess and the light is bothering his eyes.  No weakness or numbness in his extremities.  No drainage from this.  He has had previous cyst removals as well as multiple I&D's.  Home Medications Prior to Admission medications   Medication Sig Start Date End Date Taking? Authorizing Provider  ibuprofen  (ADVIL ) 800 MG tablet Take 1 tablet (800 mg total) by mouth 3 (three) times daily. Take with food 07/24/22   Triplett, Tammy, PA-C  methocarbamol  (ROBAXIN ) 500 MG tablet Take 1 tablet (500 mg total) by mouth 3 (three) times daily. 01/15/24   Triplett, Tammy, PA-C  oseltamivir  (TAMIFLU ) 75 MG capsule Take 1 capsule (75 mg total) by mouth every 12 (twelve) hours. 12/15/23   Zelaya, Oscar A, PA-C  oxyCODONE -acetaminophen  (PERCOCET/ROXICET) 5-325 MG tablet Take 1 tablet by mouth every 4 (four) hours as needed. 01/15/24   Triplett, Tammy, PA-C  predniSONE  (DELTASONE ) 10 MG tablet Take 6 tablets day one, 5 tablets day two, 4 tablets day three, 3 tablets day four, 2 tablets day five, then 1 tablet day six 01/15/24   Triplett, Tammy, PA-C  Vitamin D, Ergocalciferol, (DRISDOL) 1.25 MG (50000 UNIT) CAPS capsule Take 50,000 Units by mouth once a week. Patient not taking: Reported on 07/24/2022 08/31/21   [provider]      Allergies    Patient has no known allergies.    Review of Systems   Review of Systems  Constitutional:  Positive for chills and fever.  HENT:   Positive for facial swelling.   Eyes:  Positive for photophobia. Negative for visual disturbance.  Gastrointestinal:  Negative for vomiting.  Neurological:  Positive for headaches. Negative for weakness and numbness.    Physical Exam Updated Vital Signs BP (!) 130/90   Pulse 71   Temp 97.8 F (36.6 C) (Oral)   Resp 18   SpO2 99%  Physical Exam Vitals and nursing note reviewed.  Constitutional:      Appearance: He is well-developed.  HENT:     Head: Normocephalic and atraumatic.      Comments: Superior to the abscess, there is some generalized swelling above both eyebrows. No cellulitis. Eyes:     Extraocular Movements: Extraocular movements intact.     Pupils: Pupils are equal, round, and reactive to light.  Cardiovascular:     Rate and Rhythm: Normal rate.  Pulmonary:     Effort: Pulmonary effort is normal.  Skin:    General: Skin is warm and dry.  Neurological:     Mental Status: He is alert.     Comments: CN 3-12 grossly intact. 5/5 strength in all 4 extremities. Grossly normal sensation. Normal finger to nose.      ED Results / Procedures / Treatments   Labs (all labs ordered are listed, but only abnormal results are displayed) Labs Reviewed  BASIC METABOLIC PANEL WITH GFR  CBC WITH DIFFERENTIAL/PLATELET  EKG None  Radiology CT Maxillofacial W Contrast Result Date: 04/02/2024 CLINICAL DATA:  38 year old male with recurrent forehead abscess. Most recent symptom onset 5 days ago. EXAM: CT MAXILLOFACIAL WITH CONTRAST TECHNIQUE: Multidetector CT imaging of the maxillofacial structures was performed with intravenous contrast. Multiplanar CT image reconstructions were also generated. RADIATION DOSE REDUCTION: This exam was performed according to the departmental dose-optimization program which includes automated exposure control, adjustment of the mA and/or kV according to patient size and/or use of iterative reconstruction technique. CONTRAST:  75mL OMNIPAQUE  IOHEXOL 300 MG/ML  SOLN COMPARISON:  None Available. FINDINGS: Osseous: Mandible intact and normally located. No acute dental finding. Bilateral maxilla, zygoma, pterygoid, and nasal bones appear intact. No nasofrontal confluence bone erosion or periosteal reaction identified. Visible skull base and cervical vertebrae appear intact and aligned. Visible calvarium intact. Orbits: Intact orbital walls. Globes and intraorbital soft tissues appears symmetric and normal. But moderate to severe broad-based soft tissue thickening over the bridge of the nose does extend to the bilateral orbit medial canthus as seen on series 2, image 20. See additional details below. Sinuses: Paranasal sinuses are clear. This includes the frontal sinuses, the right frontal sinus is hypoplastic. Tympanic cavities and mastoids are clear. Nasal cavity unremarkable aside from retained secretions. Soft tissues: Moderate to severe broad-based soft tissue swelling along the midline inferior forehead at the nasofrontal confluence, series 2, image 20. Just above that level there is heterogeneous subcutaneous/scalp hypodense phlegmon on series 2, image 15 and a somewhat discrete 8 x 10 mm low-density collection suspicious for abscess on series 2, image 18 (sagittal image 47). No soft tissue gas or other complicating features. Negative visible larynx, pharynx, parapharyngeal spaces, retropharyngeal space, sublingual space, submandibular spaces, masticator and parotid spaces. Visible major vascular structures in the face and at the skull base are enhancing and patent. Limited intracranial: Negative. IMPRESSION: 1. Midline forehead / nasofrontal confluence moderate to severe soft tissue swelling with underlying heterogeneous low-density phlegmon, and a fairly circumscribed and round 8 x 10 mm intermediate density fluid collection suspicious for abscess (series 2, image 18 and series 5, image 47). 2. No underlying bony or paranasal sinus abnormality.  Orbits are spared. No other abnormality in the Face. Electronically Signed   By: Marlise Simpers M.D.   On: 04/02/2024 09:44    Procedures .Incision and Drainage  Date/Time: 04/02/2024 10:23 AM  Performed by: Jerilynn Montenegro, MD Authorized by: Jerilynn Montenegro, MD   Consent:    Consent obtained:  Verbal   Consent given by:  Patient   Risks, benefits, and alternatives were discussed: yes   Universal protocol:    Patient identity confirmed:  Verbally with patient Location:    Type:  Abscess   Size:  1 cm   Location:  Head   Head location:  Face Pre-procedure details:    Skin preparation:  Povidone-iodine  Sedation:    Sedation type:  None Anesthesia:    Anesthesia method:  Local infiltration   Local anesthetic:  Lidocaine  2% WITH epi Procedure type:    Complexity:  Simple Procedure details:    Incision types:  Single straight   Incision depth:  Dermal   Drainage:  Purulent   Drainage amount:  Moderate   Wound treatment:  Wound left open Post-procedure details:    Procedure completion:  Tolerated well, no immediate complications     Medications Ordered in ED Medications  fentaNYL (SUBLIMAZE) injection 50 mcg (50 mcg Intravenous Given 04/02/24 0901)  iohexol (OMNIPAQUE) 300 MG/ML solution 75  mL (75 mLs Intravenous Contrast Given 04/02/24 0929)  lidocaine -EPINEPHrine  (XYLOCAINE  W/EPI) 2 %-1:200000 (PF) injection 10 mL (10 mLs Other Given by Other 04/02/24 1015)    ED Course/ Medical Decision Making/ A&P                                 Medical Decision Making Amount and/or Complexity of Data Reviewed External Data Reviewed: notes. Labs: ordered.    Details: Normal WBC Radiology: ordered and independent interpretation performed.    Details: Abscess  Risk Prescription drug management.   Patient presents with an abscess.  Given his complaints of significant headache, photophobia, etc., CT was obtained to ensure this was not a deeper than suspected abscess.  CT does not show  any evidence of deeper infection, abscess was incised and drained as above without difficulty.  Labs are unremarkable.  Will refer to plastic surgery as he is had a lot of issues with recurrent abscesses/cysts and otherwise he appears stable for discharge.  The abscess is small enough that I do not think antibiotics are warranted.  Will discharge with return precautions.        Final Clinical Impression(s) / ED Diagnoses Final diagnoses:  Abscess of forehead    Rx / DC Orders ED Discharge Orders     None         Jerilynn Montenegro, MD 04/02/24 1045

## 2024-04-02 NOTE — ED Notes (Signed)
 Pt in CT.

## 2024-04-02 NOTE — Discharge Instructions (Signed)
 Apply warm compresses and you may also apply some topical antibiotic ointment.  Follow-up with your primary care physician and you are also being referred to a plastic surgeon given your recurrent abscesses and cysts on your face.  If you develop fever, new or worsening swelling or pain, or any other new/concerning symptoms, then return to the ER.

## 2024-09-04 ENCOUNTER — Emergency Department (HOSPITAL_COMMUNITY): Payer: Self-pay

## 2024-09-04 ENCOUNTER — Other Ambulatory Visit: Payer: Self-pay

## 2024-09-04 ENCOUNTER — Emergency Department (HOSPITAL_COMMUNITY)
Admission: EM | Admit: 2024-09-04 | Discharge: 2024-09-04 | Disposition: A | Discharge status: Home / Self Care | Payer: Self-pay | Attending: Emergency Medicine | Admitting: Emergency Medicine

## 2024-09-04 ENCOUNTER — Encounter (HOSPITAL_COMMUNITY): Payer: Self-pay | Admitting: Emergency Medicine

## 2024-09-04 DIAGNOSIS — S62395A Other fracture of fourth metacarpal bone, left hand, initial encounter for closed fracture: Secondary | ICD-10-CM | POA: Insufficient documentation

## 2024-09-04 MED ORDER — HYDROCODONE-ACETAMINOPHEN 5-325 MG PO TABS: 1.0000 | ORAL_TABLET | Freq: Four times a day (QID) | ORAL | 0 refills | Status: AC | PRN

## 2024-09-04 MED ORDER — HYDROCODONE-ACETAMINOPHEN 5-325 MG PO TABS
2.0000 | ORAL_TABLET | Freq: Once | ORAL | Status: AC
  Administered 2024-09-04: 2 via ORAL
  Filled 2024-09-04: qty 2

## 2024-09-04 NOTE — Discharge Instructions (Addendum)
 Wear ulnar gutter splint.  Take ibuprofen  600 mg every 6 hours as needed for pain.  Take hydrocodone  as prescribed as needed for pain not relieved with ibuprofen .  Ice for 20 minutes every 2 hours while awake for the next 2 days.  Follow-up next week with orthopedic surgery.  The contact information for Dr. Margrette has been provided in this discharge summary for you to call Monday morning and make these arrangements.
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
## 2024-09-24 ENCOUNTER — Emergency Department (HOSPITAL_COMMUNITY)
Admission: EM | Admit: 2024-09-24 | Discharge: 2024-09-24 | Disposition: A | Discharge status: Home / Self Care | Payer: Self-pay | Attending: Emergency Medicine | Admitting: Emergency Medicine

## 2024-09-24 ENCOUNTER — Emergency Department (HOSPITAL_COMMUNITY): Payer: Self-pay

## 2024-09-24 ENCOUNTER — Other Ambulatory Visit: Payer: Self-pay

## 2024-09-24 DIAGNOSIS — S62355A Nondisplaced fracture of shaft of fourth metacarpal bone, left hand, initial encounter for closed fracture: Secondary | ICD-10-CM | POA: Insufficient documentation

## 2024-09-24 DIAGNOSIS — Z79899 Other long term (current) drug therapy: Secondary | ICD-10-CM | POA: Insufficient documentation
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
